# Patient Record
Sex: Female | Born: 1970 | Race: Black or African American | Hispanic: No | Marital: Single | Smoking: Current every day smoker
Health system: Southern US, Community
[De-identification: ages and names within clinical notes are randomized; demographics above are authoritative.]

## PROBLEM LIST (undated history)

## (undated) DIAGNOSIS — I429 Cardiomyopathy, unspecified: Secondary | ICD-10-CM

## (undated) DIAGNOSIS — I5022 Chronic systolic (congestive) heart failure: Secondary | ICD-10-CM

## (undated) DIAGNOSIS — F1910 Other psychoactive substance abuse, uncomplicated: Secondary | ICD-10-CM

## (undated) DIAGNOSIS — I10 Essential (primary) hypertension: Secondary | ICD-10-CM

## (undated) HISTORY — DX: Chronic systolic (congestive) heart failure: I50.22

## (undated) HISTORY — PX: LEG SURGERY: SHX1003

---

## 2009-01-11 DIAGNOSIS — I429 Cardiomyopathy, unspecified: Secondary | ICD-10-CM

## 2009-01-11 HISTORY — DX: Cardiomyopathy, unspecified: I42.9

## 2014-03-29 ENCOUNTER — Inpatient Hospital Stay (HOSPITAL_COMMUNITY)
Admission: EM | Admit: 2014-03-29 | Discharge: 2014-04-02 | DRG: 291 | Disposition: A | Discharge status: Home / Self Care | Payer: Medicare Other | Attending: Internal Medicine | Admitting: Internal Medicine

## 2014-03-29 ENCOUNTER — Encounter (HOSPITAL_COMMUNITY): Payer: Self-pay | Admitting: Emergency Medicine

## 2014-03-29 ENCOUNTER — Emergency Department (HOSPITAL_COMMUNITY): Payer: Medicare Other

## 2014-03-29 DIAGNOSIS — I5020 Unspecified systolic (congestive) heart failure: Secondary | ICD-10-CM

## 2014-03-29 DIAGNOSIS — J9621 Acute and chronic respiratory failure with hypoxia: Secondary | ICD-10-CM | POA: Diagnosis present

## 2014-03-29 DIAGNOSIS — F329 Major depressive disorder, single episode, unspecified: Secondary | ICD-10-CM | POA: Diagnosis present

## 2014-03-29 DIAGNOSIS — J209 Acute bronchitis, unspecified: Secondary | ICD-10-CM | POA: Diagnosis present

## 2014-03-29 DIAGNOSIS — J9801 Acute bronchospasm: Secondary | ICD-10-CM | POA: Diagnosis present

## 2014-03-29 DIAGNOSIS — J90 Pleural effusion, not elsewhere classified: Secondary | ICD-10-CM

## 2014-03-29 DIAGNOSIS — R0602 Shortness of breath: Secondary | ICD-10-CM | POA: Diagnosis not present

## 2014-03-29 DIAGNOSIS — I5023 Acute on chronic systolic (congestive) heart failure: Secondary | ICD-10-CM | POA: Diagnosis not present

## 2014-03-29 DIAGNOSIS — F17210 Nicotine dependence, cigarettes, uncomplicated: Secondary | ICD-10-CM | POA: Diagnosis present

## 2014-03-29 DIAGNOSIS — F1120 Opioid dependence, uncomplicated: Secondary | ICD-10-CM | POA: Diagnosis present

## 2014-03-29 DIAGNOSIS — I5021 Acute systolic (congestive) heart failure: Secondary | ICD-10-CM

## 2014-03-29 DIAGNOSIS — R911 Solitary pulmonary nodule: Secondary | ICD-10-CM

## 2014-03-29 DIAGNOSIS — J439 Emphysema, unspecified: Secondary | ICD-10-CM | POA: Diagnosis present

## 2014-03-29 DIAGNOSIS — I10 Essential (primary) hypertension: Secondary | ICD-10-CM | POA: Diagnosis present

## 2014-03-29 DIAGNOSIS — Z79899 Other long term (current) drug therapy: Secondary | ICD-10-CM

## 2014-03-29 DIAGNOSIS — R072 Precordial pain: Secondary | ICD-10-CM

## 2014-03-29 DIAGNOSIS — O903 Peripartum cardiomyopathy: Secondary | ICD-10-CM | POA: Diagnosis present

## 2014-03-29 DIAGNOSIS — I509 Heart failure, unspecified: Secondary | ICD-10-CM

## 2014-03-29 DIAGNOSIS — J9600 Acute respiratory failure, unspecified whether with hypoxia or hypercapnia: Secondary | ICD-10-CM | POA: Diagnosis present

## 2014-03-29 HISTORY — DX: Other psychoactive substance abuse, uncomplicated: F19.10

## 2014-03-29 HISTORY — DX: Essential (primary) hypertension: I10

## 2014-03-29 HISTORY — DX: Cardiomyopathy, unspecified: I42.9

## 2014-03-29 LAB — BASIC METABOLIC PANEL
ANION GAP: 9 (ref 5–15)
BUN: 8 mg/dL (ref 6–23)
CALCIUM: 8.8 mg/dL (ref 8.4–10.5)
CO2: 26 mmol/L (ref 19–32)
CREATININE: 0.6 mg/dL (ref 0.50–1.10)
Chloride: 104 mmol/L (ref 96–112)
GFR calc Af Amer: >90 mL/min (ref >90)
GFR calc non Af Amer: >90 mL/min (ref >90)
GLUCOSE: 134 mg/dL — AB (ref 70–99)
Potassium: 3.1 mmol/L — ABNORMAL LOW (ref 3.5–5.1)
Sodium: 139 mmol/L (ref 135–145)

## 2014-03-29 LAB — CBC
HEMATOCRIT: 39.2 % (ref 36.0–46.0)
Hemoglobin: 12.9 g/dL (ref 12.0–15.0)
MCH: 29.1 pg (ref 26.0–34.0)
MCHC: 32.9 g/dL (ref 30.0–36.0)
MCV: 88.3 fL (ref 78.0–100.0)
Platelets: 174 10*3/uL (ref 150–400)
RBC: 4.44 MIL/uL (ref 3.87–5.11)
RDW: 13.4 % (ref 11.5–15.5)
WBC: 10.8 10*3/uL — AB (ref 4.0–10.5)

## 2014-03-29 LAB — BRAIN NATRIURETIC PEPTIDE: B Natriuretic Peptide: 555.1 pg/mL — ABNORMAL HIGH (ref 0.0–100.0)

## 2014-03-29 MED ORDER — FUROSEMIDE 20 MG PO TABS: 20.0000 mg | ORAL_TABLET | Freq: Every day | ORAL | Status: DC

## 2014-03-29 MED ORDER — IOHEXOL 350 MG/ML SOLN
100.0000 mL | Freq: Once | INTRAVENOUS | Status: AC | PRN
  Administered 2014-03-29: 100 mL via INTRAVENOUS

## 2014-03-29 NOTE — Discharge Instructions (Signed)
Please call your doctor for a followup appointment within 24-48 hours. When you talk to your doctor please let them know that you were seen in the emergency department and have them acquire all of your records so that they can discuss the findings with you and formulate a treatment plan to fully care for your new and ongoing problems. ° °RESOURCE GUIDE ° °Chronic Pain Problems: °Contact [facility]  [phone] °Patients need to be referred by their primary care doctor. ° °Insufficient Money for Medicine: °Contact [employer]:  call "211."  ° °No Primary Care Doctor: °- Call [facility]  [phone] - can help you locate a primary care doctor that  accepts your insurance, provides certain services, etc. °- Physician Referral Service- 1-800-533-3463 ° °Agencies that provide inexpensive medical care: °- [facility]  [phone] °- [facility]  [phone] °- [facility]  [phone] °- [facility]  [phone] °- [facility]  [phone] °- [facility]  [phone] ° °Medicaid-accepting [place] Providers: °- [facility]- [address] ° [phone], Mon-Fri 9am-7pm, Sat 9am-1pm °- [facility]- [address], [address] ° [phone] °- [facility]- [address], [address] ° [phone] °- [facility]- [address] [place] [address] ° [phone] °- [facility]- [address], [phone] ° Only accepts [place] Access Medicaid patients after they have their name  applied to their card ° °Self Pay (no insurance) in [place]: °- Sickle Cell Patients: Dr Eric Dean, [facility] ° [address], [phone] °- [facility]- [address] ° [phone] °      -     [facility]- [address] °      -     [facility]- see information above (Speak to Pam H if you do not have  insurance) °      -  [facility] [place]- [address],  [phone] °      -  [facility]- [address] [place] [address], [phone] °      -  Dr Osei-Bonsu-  [address], [place], [phone] °      -  [facility] - [address], [phone] °      -  [facility]- [address] [place] [address], [phone], also [address], [phone] °      -    [facility]- [address], [phone], 1st & 3rd Saturday °       every month, 10am-1pm ° °[facility] °[address] °, [place] [address] °([phone]) [phone] ° °[facility] °[address] °[place], [place] [address] °([phone]) [phone] ° °1) Find a Doctor and Pay Out of Pocket °Although you won't have to find out who is covered by your insurance plan, it is a good idea to ask around and get recommendations. You will then need to call the office and see if the doctor you have chosen will accept you as a new patient and what types of options they offer for patients who are self-pay. Some doctors offer discounts or will set up payment plans for their patients who do not have insurance, but you will need to ask so you aren't surprised when you   get to your appointment. ° °2) Contact Your Local Health Department °Not all health departments have doctors that can see patients for sick visits, but many do, so it is worth a call to see if yours does. If you don't know where your local health department is, you can check in your phone book. The CDC also has a tool to help you locate your state's health department, and many state websites also have listings of all of their local health departments. ° °3) Find a Walk-in Clinic °If your illness is not likely to be very severe or complicated, you may want to try a walk in clinic. These are popping up all over the country in pharmacies, drugstores, and shopping centers. They're usually staffed by nurse practitioners or physician  assistants that have been trained to treat common illnesses and complaints. They're usually fairly quick and inexpensive. However, if you have serious medical issues or chronic medical problems, these are probably not your best option ° °STD Testing °- [place] [facility] [place], [facility], [address], [place], phone [phone] or [phone].  Monday - Friday, call for an appointment. °- [place] [facility] [place], [facility], [address], [place], phone [phone] or [phone].  Monday - Friday, call for an appointment. ° °Abuse/Neglect: °- [place] Child Abuse Hotline (336) 641-3795 °- [place] Child Abuse Hotline 800-378-5315 (After Hours) ° °Emergency Shelter:  [facility] (336) 271-5985 ° °Maternity Homes: °- [facility] (336) 275-9566 °- [facility] (704) 372-4663 ° °MRSA Hotline #:   832-7006 ° °Dental Assistance °If unable to pay or uninsured, contact:  [place] [facility] to become qualified for the adult dental clinic. ° °Patients with Medicaid: [facility] °[address], [phone] °[address], [phone] ° °If unable to pay, or uninsured, contact [place] [facility] ([phone] in [place], [phone] in [place]) to become qualified for the adult dental clinic ° °[facility] °[address] °[place], [place] [address] °(336) 272-4177 °[email or web address] ° °Other Low-Cost Community Dental Services: °- [facility]- [address], [place], [place], [address], [phone], Ext. [address], 2nd and 4th Thursday of the month at 6:30am.  10 clients each day by appointment, can sometimes see walk-in patients if someone does not show for an appointment. °- [facility]- [address], [place], [place], [address], [phone] °- [facility]- [address], [place], [place],  [address], [phone] °- [facility]- [phone] °- [facility]- [phone] °- [facility]- [phone] °-  °

## 2014-03-29 NOTE — ED Notes (Signed)
Patient transported to CT 

## 2014-03-29 NOTE — ED Notes (Signed)
Pt called out requesting for something to eat or drink.  Pt made aware that her CT needs to be resulted before she can have anything.  She started to argue with this nurse and states that that's not what she was informed.  Then she states "oh well" and started to drink from the cup that she has in her purse.

## 2014-03-29 NOTE — ED Provider Notes (Signed)
CSN: [id number]     Arrival date & time 03/29/14  1707 History   First MD Initiated Contact with Patient 03/29/14 1744     Chief Complaint  Patient presents with  . Shortness of Breath     (Consider location/radiation/quality/duration/timing/severity/associated sxs/prior Treatment) HPI Comments: The patient is a 44 year old female, she reports a history of a cardiomyopathy which was postpartum when her child was born 11 years ago. 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 Several days ago the patient reports having increasing shortness of breath, some exertional dyspnea, some wheezing occasionally and some tightness in her chest. At this time the patient does not have any symptoms. She states that she aborted a pregnancy one month ago because she did not want to go back into pulmonary edema, she was having some shortness of breath at that time. She denies fevers chills nausea vomiting diarrhea or swelling of the lower extremities, she states that she has a minimal cough. She was found to be hypoxic in triage, she was placed on supplemental oxygen at 2 L with a resulting oxygen of 95%.  Patient is a 44 y.o. female presenting with shortness of breath. The history is provided by the patient.  Shortness of Breath   Past Medical History  Diagnosis Date  . Cardiomyopathy 2011   History reviewed. No pertinent past surgical history. No family history on file. History  Substance Use Topics  . Smoking status: Current Every Day Smoker -- 0.50 packs/day  . Smokeless tobacco: Not on file  . Alcohol Use: No   OB History    No data available     Review of Systems  Respiratory: Positive for shortness of breath.   All other systems reviewed and are negative.     Allergies  Review of patient's allergies indicates no known allergies.  Home Medications    Prior to Admission medications   Medication Sig Start Date End Date Taking? Authorizing Provider  carvedilol (COREG) 12.5 MG tablet Take 12.5 mg by mouth 2 (two) times daily with a meal.   Yes Historical Provider, MD  diphenhydrAMINE (BENADRYL) 25 mg capsule Take 25 mg by mouth every 6 (six) hours as needed for allergies or sleep.   Yes Historical Provider, MD  DULoxetine (CYMBALTA) 60 MG capsule Take 60 mg by mouth daily.   Yes Historical Provider, MD  furosemide (LASIX) 20 MG tablet Take 1 tablet (20 mg total) by mouth daily. 03/29/14   Eber Hong, MD  methadone (DOLOPHINE) 10 MG/ML solution Take 70 mg by mouth daily.   Yes Historical Provider, MD  valsartan (DIOVAN) 160 MG tablet Take 160 mg by mouth daily.   Yes Historical Provider, MD   BP 149/92 mmHg  Pulse 79  Temp(Src) 97.7 F (36.5 C) (Oral)  Resp 27  SpO2 94%  LMP 10/11/2013 (Approximate) Physical Exam  Constitutional: She appears well-developed and well-nourished. No distress.  HENT:  Head: Normocephalic and atraumatic.  Mouth/Throat: Oropharynx is clear and moist. No oropharyngeal exudate.  Eyes: Conjunctivae and EOM are normal. Pupils are equal, round, and reactive to light. Right eye exhibits no discharge. Left eye exhibits no discharge. No scleral icterus.  Neck: Normal range of motion. Neck supple. No JVD present. No thyromegaly present.  Cardiovascular: Normal rate, regular rhythm, normal heart sounds and intact distal pulses.  Exam reveals no gallop  and no friction rub.   No murmur heard. Pulmonary/Chest: Effort normal and breath sounds normal. No respiratory distress. She has no wheezes. She has no rales.  Abdominal: Soft. Bowel sounds are normal. She exhibits no distension and no mass. There is no tenderness.  Musculoskeletal: Normal range of motion. She exhibits no edema or tenderness.  Lymphadenopathy:    She has no cervical adenopathy.  Neurological: She is alert. Coordination normal.  Skin: Skin is warm and  dry. No rash noted. No erythema.  Psychiatric: She has a normal mood and affect. Her behavior is normal.  Nursing note and vitals reviewed.   ED Course  Procedures (including critical care time) Labs Review Labs Reviewed  CBC - Abnormal; Notable for the following:    WBC 10.8 (*)    All other components within normal limits  BASIC METABOLIC PANEL - Abnormal; Notable for the following:    Potassium 3.1 (*)    Glucose, Bld 134 (*)    All other components within normal limits  BRAIN NATRIURETIC PEPTIDE - Abnormal; Notable for the following:    B Natriuretic Peptide 555.1 (*)    All other components within normal limits  I-STAT TROPOININ, ED  POC URINE PREG, ED    Imaging Review Dg Chest 2 View  03/29/2014   CLINICAL DATA:  Shortness of breath, dry cough.  Smoker.  EXAM: CHEST  2 VIEW  COMPARISON:  None.  FINDINGS: Heart size enlarged. Aortic arch with mild atherosclerosis. Bihilar fullness. Right greater than left lung base opacities. No pleural effusion or pneumothorax. No acute osseous finding.  IMPRESSION: Heart size enlarged.  Bihilar fullness may reflect vascular congestion. Is there outside prior imaging to document stability? If not, consider chest CT to exclude adenopathy.  Lung base opacities may reflect atelectasis, scarring, and/or pneumonia.   Electronically Signed   By: Jearld Lesch M.D.   On: 03/29/2014 19:07   Ct Angio Chest Pe W/cm &/or Wo Cm  03/29/2014   CLINICAL DATA:  Shortness of breath for 3 days wheezing for 2 days with chest tightness. History of cardiomyopathy.  EXAM: CT ANGIOGRAPHY CHEST WITH CONTRAST  TECHNIQUE: Multidetector CT imaging of the chest was performed using the standard protocol during bolus administration of intravenous contrast. Multiplanar CT image reconstructions and MIPs were obtained to evaluate the vascular anatomy.  CONTRAST:  OMNIPAQUE IOHEXOL 350 MG/ML SOLN  COMPARISON:  Chest radiograph March 29, 2014 at 1849 hours  FINDINGS:  PULMONARY ARTERY: Adequate contrast opacification of the pulmonary artery's. Main pulmonary artery is not enlarged. No pulmonary arterial filling defects to the level of the subsegmental branches.  MEDIASTINUM: Heart is moderately enlarged. No RIGHT heart strain. No pericardial fluid collections contrast refluxing into the inferior vena cava. Thoracic aorta is normal course and caliber, unremarkable. Hilar lymphadenopathy bilaterally, borderline subcarinal lymphadenopathy.  LUNGS: Tracheobronchial tree is patent, no pneumothorax. Peribronchial wall thickening. Moderate apparent centrilobular emphysema. Patchy consolidation in the RIGHT greater than LEFT lung bases with small RIGHT pleural effusion. 5 mm sub solid pulmonary nodule LEFT lung apex. Mild paraseptal emphysema.  SOFT TISSUES AND OSSEOUS STRUCTURES: Included view of the abdomen is unremarkable. Visualized soft tissues and included osseous structures appear normal.  Review of the MIP images confirms the above findings.  IMPRESSION: No acute pulmonary embolism.  Moderate cardiomegaly. Bronchial wall thickening may refer reflect bronchitis or pulmonary edema with small RIGHT pleural effusion. Patchy presumed atelectasis in the lung bases.  Symmetric hilar lymphadenopathy, which could be infectious or inflammatory, recommend  follow-up.  Moderate centrilobular and to lesser extent paraseptal emphysema.  5 mm LEFT apical sub solid pulmonary nodule. Initial follow-up by chest CT without contrast is recommended in 3 months to confirm persistence. This recommendation follows the consensus statement: Recommendations for the Management of Subsolid Pulmonary Nodules Detected at CT: A Statement from the Fleischner Society as published in Radiology 2013; 266:304-317.   Electronically Signed   By: Awilda Metroourtnay  Bloomer   On: 03/29/2014 22:27     EKG Interpretation   Date/Time:  Friday March 29 2014 17:39:17 EDT Ventricular Rate:  83 PR Interval:  134 QRS Duration:  94 QT Interval:  437 QTC Calculation: 513 R Axis:   2 Text Interpretation:  Sinus rhythm LVH with secondary repolarization  abnormality Prolonged QT interval Baseline wander in lead(s) V5 V6  Abnormal ekg No old tracing to compare Confirmed by Marge Vandermeulen  MD, Calvina Liptak  [id number]) on 03/29/2014 5:44:52 PM      MDM   Final diagnoses:  SOB (shortness of breath)  Pleural effusion  Systolic congestive heart failure, unspecified congestive heart failure chronicity  Lung nodule    The patient has no JVD, no wheezing, oxygen of 95% on room air on my exam, there is no peripheral edema, no rales, check a chest x-ray and labs, EKG is abnormal with T wave inversions, there is no old tracing as the patient has just recently moved here. I suspect that her abnormal findings are related to left ventricular hypertrophy.  Pt has had ongoing dyspnea, she has hypoxia with ambulation - CT shows no obvious source, BNP > 400, will need admission.    Eber HongBrian Pearlean Sabina, MD 03/30/14 [phone]

## 2014-03-29 NOTE — ED Notes (Signed)
Patient transported to MRI 

## 2014-03-29 NOTE — ED Notes (Signed)
Pt is with CT 

## 2014-03-29 NOTE — ED Notes (Signed)
Went on to speak to pt, pt was asking for something to drink.  Explained to pt the reason that she cannot have anything to eat or drink at this time.  Pt then reports to this nurse that she is upset towards her daughter.  She states that she had asked her not to go to sleep and to come back to the ED to stay with her, but is now not picking up her phone.  She states that she shouldve stayed in [place] instead of moving here with her daughter.  Pt reports that she has been hospitalized in the past for 10 days for her cardiac problem and fluids in her lungs and her heart.

## 2014-03-29 NOTE — ED Notes (Addendum)
Patient came in due to shortness of breath.  Patient denies chest pain, is not drooling, and chest is assessed to be symmetrical upon breathing. Patient vocalized that she feels as though she has been wheezing since yesterday.  Patient feels as though her chest is "tight and closing in on me". Patient states she has a hx of cardiomyopathy dx in 2004.  Explained they found "fluid around my heart and lungs".  Patient was placed on 2L via nasal cannula and SpO2 was increased to 93%.

## 2014-03-29 NOTE — ED Notes (Signed)
Patient transported to X-ray 

## 2014-03-29 NOTE — ED Notes (Signed)
Pt reports SOB, hx of cardiomyopathy.  Denies cp at this time.  Pt was hypoxic on RA.  Placed on O2.  Pt reports she does not have home O2.

## 2014-03-29 NOTE — ED Notes (Signed)
Walked the pt around the department. Upon arriving back into her room her O2 stats were 85

## 2014-03-30 ENCOUNTER — Inpatient Hospital Stay (HOSPITAL_COMMUNITY): Payer: Medicare Other

## 2014-03-30 ENCOUNTER — Encounter (HOSPITAL_COMMUNITY): Payer: Self-pay | Admitting: Emergency Medicine

## 2014-03-30 DIAGNOSIS — I509 Heart failure, unspecified: Secondary | ICD-10-CM | POA: Diagnosis not present

## 2014-03-30 DIAGNOSIS — I5021 Acute systolic (congestive) heart failure: Secondary | ICD-10-CM | POA: Diagnosis not present

## 2014-03-30 DIAGNOSIS — R911 Solitary pulmonary nodule: Secondary | ICD-10-CM | POA: Diagnosis present

## 2014-03-30 DIAGNOSIS — F17210 Nicotine dependence, cigarettes, uncomplicated: Secondary | ICD-10-CM | POA: Diagnosis present

## 2014-03-30 DIAGNOSIS — I5023 Acute on chronic systolic (congestive) heart failure: Secondary | ICD-10-CM | POA: Diagnosis present

## 2014-03-30 DIAGNOSIS — J9801 Acute bronchospasm: Secondary | ICD-10-CM | POA: Diagnosis present

## 2014-03-30 DIAGNOSIS — R0602 Shortness of breath: Secondary | ICD-10-CM | POA: Insufficient documentation

## 2014-03-30 DIAGNOSIS — J9600 Acute respiratory failure, unspecified whether with hypoxia or hypercapnia: Secondary | ICD-10-CM | POA: Diagnosis present

## 2014-03-30 DIAGNOSIS — Z79899 Other long term (current) drug therapy: Secondary | ICD-10-CM | POA: Diagnosis not present

## 2014-03-30 DIAGNOSIS — F1120 Opioid dependence, uncomplicated: Secondary | ICD-10-CM | POA: Diagnosis present

## 2014-03-30 DIAGNOSIS — J209 Acute bronchitis, unspecified: Secondary | ICD-10-CM | POA: Diagnosis present

## 2014-03-30 DIAGNOSIS — J9621 Acute and chronic respiratory failure with hypoxia: Secondary | ICD-10-CM | POA: Diagnosis present

## 2014-03-30 DIAGNOSIS — O903 Peripartum cardiomyopathy: Secondary | ICD-10-CM | POA: Diagnosis present

## 2014-03-30 DIAGNOSIS — I10 Essential (primary) hypertension: Secondary | ICD-10-CM | POA: Diagnosis present

## 2014-03-30 DIAGNOSIS — I517 Cardiomegaly: Secondary | ICD-10-CM | POA: Diagnosis not present

## 2014-03-30 DIAGNOSIS — J439 Emphysema, unspecified: Secondary | ICD-10-CM | POA: Diagnosis present

## 2014-03-30 DIAGNOSIS — F329 Major depressive disorder, single episode, unspecified: Secondary | ICD-10-CM | POA: Diagnosis present

## 2014-03-30 LAB — COMPREHENSIVE METABOLIC PANEL
ALBUMIN: 3.4 g/dL — AB (ref 3.5–5.2)
ALT: 14 U/L (ref 0–35)
AST: 17 U/L (ref 0–37)
Alkaline Phosphatase: 66 U/L (ref 39–117)
Anion gap: 10 (ref 5–15)
BILIRUBIN TOTAL: 0.5 mg/dL (ref 0.3–1.2)
BUN: 6 mg/dL (ref 6–23)
CHLORIDE: 105 mmol/L (ref 96–112)
CO2: 25 mmol/L (ref 19–32)
Calcium: 8.8 mg/dL (ref 8.4–10.5)
Creatinine, Ser: 0.46 mg/dL — ABNORMAL LOW (ref 0.50–1.10)
GFR calc Af Amer: >90 mL/min (ref >90)
GFR calc non Af Amer: >90 mL/min (ref >90)
GLUCOSE: 103 mg/dL — AB (ref 70–99)
Potassium: 3.6 mmol/L (ref 3.5–5.1)
SODIUM: 140 mmol/L (ref 135–145)
TOTAL PROTEIN: 6.8 g/dL (ref 6.0–8.3)

## 2014-03-30 LAB — BLOOD GAS, ARTERIAL
Acid-Base Excess: 0.5 mmol/L (ref 0.0–2.0)
Bicarbonate: 24.4 mEq/L — ABNORMAL HIGH (ref 20.0–24.0)
Drawn by: 331471
FIO2: 0.55 %
O2 Saturation: 92 %
PCO2 ART: 38.7 mmHg (ref 35.0–45.0)
PH ART: 7.415 (ref 7.350–7.450)
Patient temperature: 98.6
TCO2: 21.3 mmol/L (ref 0–100)
pO2, Arterial: 66.8 mmHg — ABNORMAL LOW (ref 80.0–100.0)

## 2014-03-30 LAB — CBC WITH DIFFERENTIAL/PLATELET
BASOS ABS: 0 10*3/uL (ref 0.0–0.1)
Basophils Relative: 0 % (ref 0–1)
EOS ABS: 0.2 10*3/uL (ref 0.0–0.7)
Eosinophils Relative: 2 % (ref 0–5)
HCT: 37.4 % (ref 36.0–46.0)
Hemoglobin: 12.4 g/dL (ref 12.0–15.0)
Lymphocytes Relative: 44 % (ref 12–46)
Lymphs Abs: 4.4 10*3/uL — ABNORMAL HIGH (ref 0.7–4.0)
MCH: 29.2 pg (ref 26.0–34.0)
MCHC: 33.2 g/dL (ref 30.0–36.0)
MCV: 88 fL (ref 78.0–100.0)
MONOS PCT: 10 % (ref 3–12)
Monocytes Absolute: 1 10*3/uL (ref 0.1–1.0)
NEUTROS ABS: 4.3 10*3/uL (ref 1.7–7.7)
Neutrophils Relative %: 44 % (ref 43–77)
Platelets: 152 10*3/uL (ref 150–400)
RBC: 4.25 MIL/uL (ref 3.87–5.11)
RDW: 13.6 % (ref 11.5–15.5)
WBC: 9.9 10*3/uL (ref 4.0–10.5)

## 2014-03-30 LAB — RAPID URINE DRUG SCREEN, HOSP PERFORMED
Amphetamines: NOT DETECTED
BARBITURATES: NOT DETECTED
Benzodiazepines: NOT DETECTED
Cocaine: NOT DETECTED
Opiates: NOT DETECTED
TETRAHYDROCANNABINOL: NOT DETECTED

## 2014-03-30 LAB — CBC
HCT: 37.7 % (ref 36.0–46.0)
Hemoglobin: 12.4 g/dL (ref 12.0–15.0)
MCH: 28.9 pg (ref 26.0–34.0)
MCHC: 32.9 g/dL (ref 30.0–36.0)
MCV: 87.9 fL (ref 78.0–100.0)
PLATELETS: 172 10*3/uL (ref 150–400)
RBC: 4.29 MIL/uL (ref 3.87–5.11)
RDW: 13.7 % (ref 11.5–15.5)
WBC: 10 10*3/uL (ref 4.0–10.5)

## 2014-03-30 LAB — TROPONIN I: Troponin I: <0.03 ng/mL (ref <0.031)

## 2014-03-30 LAB — CREATININE, SERUM
Creatinine, Ser: 0.53 mg/dL (ref 0.50–1.10)
GFR calc Af Amer: >90 mL/min (ref >90)
GFR calc non Af Amer: >90 mL/min (ref >90)

## 2014-03-30 LAB — C-REACTIVE PROTEIN: CRP: 1.2 mg/dL — AB (ref <0.60)

## 2014-03-30 LAB — SEDIMENTATION RATE: Sed Rate: 30 mm/hr — ABNORMAL HIGH (ref 0–22)

## 2014-03-30 LAB — MAGNESIUM: Magnesium: 1.9 mg/dL (ref 1.5–2.5)

## 2014-03-30 LAB — MRSA PCR SCREENING: MRSA by PCR: NEGATIVE

## 2014-03-30 MED ORDER — ZOLPIDEM TARTRATE 5 MG PO TABS
5.0000 mg | ORAL_TABLET | Freq: Once | ORAL | Status: AC
  Administered 2014-03-30: 5 mg via ORAL
  Filled 2014-03-30: qty 1

## 2014-03-30 MED ORDER — IPRATROPIUM-ALBUTEROL 0.5-2.5 (3) MG/3ML IN SOLN: 3.0000 mL | RESPIRATORY_TRACT | Status: DC | PRN

## 2014-03-30 MED ORDER — DOXYCYCLINE HYCLATE 100 MG IV SOLR
100.0000 mg | Freq: Two times a day (BID) | INTRAVENOUS | Status: DC
  Administered 2014-03-30: 100 mg via INTRAVENOUS
  Filled 2014-03-30 (×2): qty 100

## 2014-03-30 MED ORDER — PIPERACILLIN-TAZOBACTAM 3.375 G IVPB
3.3750 g | Freq: Four times a day (QID) | INTRAVENOUS | Status: DC
  Administered 2014-03-30: 3.375 g via INTRAVENOUS
  Filled 2014-03-30: qty 50

## 2014-03-30 MED ORDER — METHADONE HCL 10 MG/ML PO CONC
70.0000 mg | Freq: Every day | ORAL | Status: DC
  Administered 2014-03-30 – 2014-04-02 (×4): 70 mg via ORAL
  Filled 2014-03-30 (×4): qty 7

## 2014-03-30 MED ORDER — VANCOMYCIN HCL IN DEXTROSE 1-5 GM/200ML-% IV SOLN
1000.0000 mg | Freq: Two times a day (BID) | INTRAVENOUS | Status: DC
  Administered 2014-03-30 – 2014-03-31 (×3): 1000 mg via INTRAVENOUS
  Filled 2014-03-30 (×4): qty 200

## 2014-03-30 MED ORDER — HEPARIN SODIUM (PORCINE) 5000 UNIT/ML IJ SOLN
5000.0000 [IU] | Freq: Three times a day (TID) | INTRAMUSCULAR | Status: DC
  Administered 2014-03-30 – 2014-04-02 (×12): 5000 [IU] via SUBCUTANEOUS
  Filled 2014-03-30 (×15): qty 1

## 2014-03-30 MED ORDER — IRBESARTAN 150 MG PO TABS
150.0000 mg | ORAL_TABLET | Freq: Every day | ORAL | Status: DC
  Administered 2014-03-31: 150 mg via ORAL
  Filled 2014-03-30 (×3): qty 1

## 2014-03-30 MED ORDER — IPRATROPIUM-ALBUTEROL 0.5-2.5 (3) MG/3ML IN SOLN
3.0000 mL | Freq: Three times a day (TID) | RESPIRATORY_TRACT | Status: DC
  Administered 2014-03-31 – 2014-04-02 (×7): 3 mL via RESPIRATORY_TRACT
  Filled 2014-03-30 (×8): qty 3

## 2014-03-30 MED ORDER — LORAZEPAM 2 MG/ML IJ SOLN
1.0000 mg | Freq: Once | INTRAMUSCULAR | Status: AC
  Administered 2014-03-30: 1 mg via INTRAVENOUS
  Filled 2014-03-30: qty 1

## 2014-03-30 MED ORDER — IPRATROPIUM-ALBUTEROL 0.5-2.5 (3) MG/3ML IN SOLN
3.0000 mL | RESPIRATORY_TRACT | Status: DC
  Administered 2014-03-30 (×5): 3 mL via RESPIRATORY_TRACT
  Filled 2014-03-30 (×5): qty 3

## 2014-03-30 MED ORDER — ONDANSETRON HCL 4 MG/2ML IJ SOLN: 4.0000 mg | Freq: Three times a day (TID) | INTRAMUSCULAR | Status: AC | PRN

## 2014-03-30 MED ORDER — FUROSEMIDE 10 MG/ML IJ SOLN
40.0000 mg | Freq: Two times a day (BID) | INTRAMUSCULAR | Status: DC
  Administered 2014-03-30 – 2014-04-01 (×6): 40 mg via INTRAVENOUS
  Filled 2014-03-30 (×6): qty 4

## 2014-03-30 MED ORDER — METHYLPREDNISOLONE SODIUM SUCC 125 MG IJ SOLR
60.0000 mg | Freq: Four times a day (QID) | INTRAMUSCULAR | Status: DC
  Administered 2014-03-30 – 2014-03-31 (×4): 60 mg via INTRAVENOUS
  Filled 2014-03-30 (×4): qty 2

## 2014-03-30 MED ORDER — LORAZEPAM 2 MG/ML IJ SOLN
1.0000 mg | Freq: Three times a day (TID) | INTRAMUSCULAR | Status: DC | PRN
  Administered 2014-03-31: 1 mg via INTRAVENOUS
  Filled 2014-03-30: qty 1

## 2014-03-30 MED ORDER — DULOXETINE HCL 60 MG PO CPEP
60.0000 mg | ORAL_CAPSULE | Freq: Every day | ORAL | Status: DC
  Administered 2014-03-31 – 2014-04-02 (×3): 60 mg via ORAL
  Filled 2014-03-30 (×3): qty 1
  Filled 2014-03-30: qty 2
  Filled 2014-03-30: qty 1

## 2014-03-30 MED ORDER — METHYLPREDNISOLONE SODIUM SUCC 125 MG IJ SOLR
125.0000 mg | Freq: Two times a day (BID) | INTRAMUSCULAR | Status: DC
  Administered 2014-03-30: 125 mg via INTRAVENOUS
  Filled 2014-03-30 (×2): qty 2

## 2014-03-30 MED ORDER — NICOTINE 14 MG/24HR TD PT24
14.0000 mg | MEDICATED_PATCH | Freq: Every day | TRANSDERMAL | Status: DC
  Administered 2014-03-30 – 2014-04-02 (×4): 14 mg via TRANSDERMAL
  Filled 2014-03-30 (×4): qty 1

## 2014-03-30 MED ORDER — POTASSIUM CHLORIDE IN NACL 20-0.9 MEQ/L-% IV SOLN
INTRAVENOUS | Status: DC
  Administered 2014-03-30: 04:00:00 via INTRAVENOUS
  Filled 2014-03-30 (×2): qty 1000

## 2014-03-30 MED ORDER — PIPERACILLIN-TAZOBACTAM 3.375 G IVPB
3.3750 g | Freq: Three times a day (TID) | INTRAVENOUS | Status: DC
  Administered 2014-03-30 – 2014-04-02 (×9): 3.375 g via INTRAVENOUS
  Filled 2014-03-30 (×9): qty 50

## 2014-03-30 MED ORDER — SODIUM CHLORIDE 0.9 % IJ SOLN
3.0000 mL | Freq: Two times a day (BID) | INTRAMUSCULAR | Status: DC
  Administered 2014-03-30 – 2014-04-02 (×6): 3 mL via INTRAVENOUS

## 2014-03-30 MED ORDER — CARVEDILOL 12.5 MG PO TABS
12.5000 mg | ORAL_TABLET | Freq: Two times a day (BID) | ORAL | Status: DC
  Administered 2014-03-30 – 2014-04-02 (×7): 12.5 mg via ORAL
  Filled 2014-03-30 (×12): qty 1

## 2014-03-30 NOTE — ED Notes (Signed)
Called to give report. Receiving RN participating in a code blue at the time.

## 2014-03-30 NOTE — ED Notes (Signed)
Secretary paged MD without call back, will page again.

## 2014-03-30 NOTE — ED Notes (Signed)
Bed: [id number] Expected date:  Expected time:  Means of arrival:  Comments: Hold for room 19

## 2014-03-30 NOTE — H&P (Signed)
Hospitalist Admission History and Physical  Patient name: Julia Payne Medical record number: [id number] Date of birth: Mar 12, 1970 Age: 44 y.o. Gender: female  Primary Care Provider: No primary care provider on file.  Chief Complaint: acute resp failure w/ hypoxia  History of Present Illness:This is a 44 y.o. year old female with significant past medical history of postpartum cardiomyopathy, tobacco abuse, IV drug use presenting with acute respiratory failure with hypoxia. Patient reports approximate one month of progressive shortness of breath as well as subjective fevers and chills at home. Is originally from [place]. Relocated to the area recently. 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 Also reports a long-standing history of tobacco abuse. History of IV heroin use. Patient states she quit using approximately 1 month ago. Denies any other illicit drug use. Positive wheezing. Positive cough. Denies any lower extremity swelling. Progressively worsening DOE. Presented to the ER temperature 97.7, heart rate in the 60s to 80s, respirations and intense 20s, blood pressure in the 130s to 140s. Satting 92% on 2 L. However, desats into the mid 80s with ambulation. White blood cell count 10.8, hemoglobin 12.9, creatinine 0.6, potassium 3.1. CT NGO negative for PE. However does show moderate cardiomegaly bronchial wall thickening which may reflect bronchitis or edema. Positive symmetric hilar lymphadenopathy which could be infectious or inflammatory. Moderates emphysema. 5 mm left apical sub-solid pulmonary nodule. BNP 555. EKG sinus rhythm with LVH.    Assessment and Plan: Julia Salesynisha Pfiffner is a 44 y.o. year old female presenting with acute resp failure w/ hypoxia    Active Problems:   Dyspnea   Acute respiratory failure   1- Acute resp failure w/ hypoxia -Likely multifactorial in the setting of subclinical/undiagnosed obstructive lung disease,?  Endocarditis -Continue supplemental oxygen. -IV Solu-Medrol -DuoNeb's -IV doxycycline  2-? Endocarditis -High concern for endocardial disease given overall presentation and IV drug use. -Noted hilar adenopathy on CT imaging as well as subtle leukocytosis -No Roth spots or petechiae on exam, however patient does have painted fingernails -Blood culture set 2 drawn at separate times -TEE -approximately 2 minor modified [facility] criteria at present -Case discussed with on-call cardiology -Formal consultation in the morning  3-HTN -BP stable -Continue home regimen  4-long term narcotic use -Continue methadone  FEN/GI: Heart healthy diet Prophylaxis: Subcutaneous heparin Disposition: Pending further evaluation Code Status: Full code   Patient Active Problem List   Diagnosis Date Noted  . Dyspnea 03/30/2014  . Acute respiratory failure 03/30/2014   Past Medical History: Past Medical History  Diagnosis Date  . Cardiomyopathy 2011    Past Surgical History: History reviewed. No pertinent past surgical history.  Social History: History   Social History  . Marital Status: Single    Spouse Name: N/A  . Number of Children: N/A  . Years of Education: N/A   Social History Main Topics  . Smoking status: Current Every Day Smoker -- 0.50 packs/day  . Smokeless tobacco: Not on file  . Alcohol Use: No  . Drug Use: No  . Sexual Activity: Not on file   Other Topics Concern  . None   Social History Narrative  . None    Family History: No family history on file.  Allergies: No Known Allergies  Current Facility-Administered Medications  Medication Dose Route Frequency Provider Last Rate Last Dose  . 0.9 % NaCl with KCl 20 mEq/ L  infusion   Intravenous Continuous Floydene FlockSteven J Mirabelle Cyphers, MD      . carvedilol (COREG) tablet 12.5  mg  12.5 mg Oral BID WC Floydene Flock, MD      . DULoxetine (CYMBALTA) DR capsule 60 mg  60 mg Oral Daily Floydene Flock, MD      . heparin  injection 5,000 Units  5,000 Units Subcutaneous 3 times per day Floydene Flock, MD      . irbesartan (AVAPRO) tablet 150 mg  150 mg Oral Daily Floydene Flock, MD      . methadone (DOLOPHINE) 10 MG/ML solution 70 mg  70 mg Oral Daily Floydene Flock, MD      . sodium chloride 0.9 % injection 3 mL  3 mL Intravenous Q12H Floydene Flock, MD       Current Outpatient Prescriptions  Medication Sig Dispense Refill  . carvedilol (COREG) 12.5 MG tablet Take 12.5 mg by mouth 2 (two) times daily with a meal.    . diphenhydrAMINE (BENADRYL) 25 mg capsule Take 25 mg by mouth every 6 (six) hours as needed for allergies or sleep.    . DULoxetine (CYMBALTA) 60 MG capsule Take 60 mg by mouth daily.    . furosemide (LASIX) 20 MG tablet Take 1 tablet (20 mg total) by mouth daily. 10 tablet 0  . methadone (DOLOPHINE) 10 MG/ML solution Take 70 mg by mouth daily.    . valsartan (DIOVAN) 160 MG tablet Take 160 mg by mouth daily.     Review Of Systems: 12 point ROS negative except as noted above in HPI.  Physical Exam: Filed Vitals:   03/30/14 0030  BP: 144/87  Pulse: 71  Temp:   Resp: 20    General: cooperative and Mildly disheveled HEENT: PERRLA and extra ocular movement intact Heart: S1, S2 normal, no murmur, rub or gallop, regular rate and rhythm Lungs: clear to auscultation, no wheezes or rales and unlabored breathing Abdomen: abdomen is soft without significant tenderness, masses, organomegaly or guarding Extremities: extremities normal, atraumatic, no cyanosis or edema Skin:no rashes, no ecchymoses Neurology: normal without focal findings  Labs and Imaging: Lab Results  Component Value Date/Time   NA 139 03/29/2014 06:12 PM   K 3.1* 03/29/2014 06:12 PM   CL 104 03/29/2014 06:12 PM   CO2 26 03/29/2014 06:12 PM   BUN 8 03/29/2014 06:12 PM   CREATININE 0.60 03/29/2014 06:12 PM   GLUCOSE 134* 03/29/2014 06:12 PM   Lab Results  Component Value Date   WBC 10.8* 03/29/2014   HGB 12.9  03/29/2014   HCT 39.2 03/29/2014   MCV 88.3 03/29/2014   PLT 174 03/29/2014    Dg Chest 2 View  03/29/2014   CLINICAL DATA:  Shortness of breath, dry cough.  Smoker.  EXAM: CHEST  2 VIEW  COMPARISON:  None.  FINDINGS: Heart size enlarged. Aortic arch with mild atherosclerosis. Bihilar fullness. Right greater than left lung base opacities. No pleural effusion or pneumothorax. No acute osseous finding.  IMPRESSION: Heart size enlarged.  Bihilar fullness may reflect vascular congestion. Is there outside prior imaging to document stability? If not, consider chest CT to exclude adenopathy.  Lung base opacities may reflect atelectasis, scarring, and/or pneumonia.   Electronically Signed   By: Jearld Lesch M.D.   On: 03/29/2014 19:07   Ct Angio Chest Pe W/cm &/or Wo Cm  03/29/2014   CLINICAL DATA:  Shortness of breath for 3 days wheezing for 2 days with chest tightness. History of cardiomyopathy.  EXAM: CT ANGIOGRAPHY CHEST WITH CONTRAST  TECHNIQUE: Multidetector CT imaging of the chest was  performed using the standard protocol during bolus administration of intravenous contrast. Multiplanar CT image reconstructions and MIPs were obtained to evaluate the vascular anatomy.  CONTRAST:  OMNIPAQUE IOHEXOL 350 MG/ML SOLN  COMPARISON:  Chest radiograph March 29, 2014 at 1849 hours  FINDINGS: PULMONARY ARTERY: Adequate contrast opacification of the pulmonary artery's. Main pulmonary artery is not enlarged. No pulmonary arterial filling defects to the level of the subsegmental branches.  MEDIASTINUM: Heart is moderately enlarged. No RIGHT heart strain. No pericardial fluid collections contrast refluxing into the inferior vena cava. Thoracic aorta is normal course and caliber, unremarkable. Hilar lymphadenopathy bilaterally, borderline subcarinal lymphadenopathy.  LUNGS: Tracheobronchial tree is patent, no pneumothorax. Peribronchial wall thickening. Moderate apparent centrilobular emphysema. Patchy  consolidation in the RIGHT greater than LEFT lung bases with small RIGHT pleural effusion. 5 mm sub solid pulmonary nodule LEFT lung apex. Mild paraseptal emphysema.  SOFT TISSUES AND OSSEOUS STRUCTURES: Included view of the abdomen is unremarkable. Visualized soft tissues and included osseous structures appear normal.  Review of the MIP images confirms the above findings.  IMPRESSION: No acute pulmonary embolism.  Moderate cardiomegaly. Bronchial wall thickening may refer reflect bronchitis or pulmonary edema with small RIGHT pleural effusion. Patchy presumed atelectasis in the lung bases.  Symmetric hilar lymphadenopathy, which could be infectious or inflammatory, recommend follow-up.  Moderate centrilobular and to lesser extent paraseptal emphysema.  5 mm LEFT apical sub solid pulmonary nodule. Initial follow-up by chest CT without contrast is recommended in 3 months to confirm persistence. This recommendation follows the consensus statement: Recommendations for the Management of Subsolid Pulmonary Nodules Detected at CT: A Statement from the Fleischner Society as published in Radiology 2013; 266:304-317.   Electronically Signed   By: Awilda Metro   On: 03/29/2014 22:27           Doree Albee MD  Pager: [phone]

## 2014-03-30 NOTE — Consult Note (Signed)
Admit date: 03/29/2014 Referring Physician  Dr. Heron NayVasireddy Primary Physician  None Primary Cardiologist  None Reason for Consultation  SOB with hypoxia  HPI: This is a 44 y.o. year old female with significant past medical history of postpartum cardiomyopathy, tobacco abuse, IV drug use presenting with acute respiratory failure with hypoxia. Patient reports approximate one month of progressive shortness of breath as well as subjective fevers and chills at home. Is originally from [place]. Relocated to the area recently. 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 Also reports a long-standing history of tobacco abuse. History of IV heroin use. Patient states she quit using approximately 1 month ago. Denies any other illicit drug use. Positive wheezing. Positive cough. Denies any lower extremity swelling. Progressively worsening DOE. Presented to the ER temperature 97.7, heart rate in the 60s to 80s, respirations and intense 20s, blood pressure in the 130s to 140s. Satting 92% on 2 L. However, desats into the mid 80s with ambulation. White blood cell count 10.8, hemoglobin 12.9, creatinine 0.6, potassium 3.1. CT NGO negative for PE. However does show moderate cardiomegaly bronchial wall thickening which may reflect bronchitis or edema. Positive symmetric hilar lymphadenopathy which could be infectious or inflammatory. Moderates emphysema. 5 mm left apical sub-solid pulmonary nodule. BNP 555. EKG sinus rhythm with LVH.  Cardiology asked to consult due to concern of possible endocarditis as well as CHF with history of post partum DCM.  She denies any chest pressure but has had some needle like sharp tingling pain in her chest.  She complains of LE edema for several days but has none on exam today.  She does not follow a low sodium diet.  She has had a nonproductive cough and subjective feeling of hot and cold but has not taken her temperature.       PMH:   Past Medical  History  Diagnosis Date  . Cardiomyopathy 2011  . Drug abuse   . Hypertension      PSH:  History reviewed. No pertinent past surgical history.  Allergies:  Review of patient's allergies indicates no known allergies. Prior to Admit Meds:   (Not in a hospital admission) Fam HX:   No family history on file. Social HX:    History   Social History  . Marital Status: Single    Spouse Name: N/A  . Number of Children: N/A  . Years of Education: N/A   Occupational History  . Not on file.   Social History Main Topics  . Smoking status: Current Every Day Smoker -- 0.50 packs/day  . Smokeless tobacco: Not on file  . Alcohol Use: No  . Drug Use: No  . Sexual Activity: Not on file   Other Topics Concern  . Not on file   Social History Narrative     ROS:  All 11 ROS were addressed and are negative except what is stated in the HPI  Physical Exam: Blood pressure 159/94, pulse 88, temperature 97.7 F (36.5 C), temperature source Oral, resp. rate 28, last menstrual period 10/11/2013, SpO2 94 %.    General: Well developed, well nourished, in no acute distress Head: Eyes PERRLA, No xanthomas.   Normal cephalic and atramatic  Lungs:   Clear bilaterally to auscultation and percussion. Heart:   HRRR S1 S2 Pulses are 2+ & equal.            No carotid bruit. No JVD.  No abdominal bruits. No femoral bruits. Abdomen: Bowel sounds are positive,  abdomen soft and non-tender without masses  Extremities:   No clubbing, cyanosis or edema.  DP +1 Neuro: Alert and oriented X 3. Psych:  Good affect, responds appropriately    Labs:   Lab Results  Component Value Date   WBC 9.9 03/30/2014   HGB 12.4 03/30/2014   HCT 37.4 03/30/2014   MCV 88.0 03/30/2014   PLT 152 03/30/2014    Recent Labs Lab 03/30/14 0515  NA 140  K 3.6  CL 105  CO2 25  BUN 6  CREATININE 0.46*  CALCIUM 8.8  PROT 6.8  BILITOT 0.5  ALKPHOS 66  ALT 14  AST 17  GLUCOSE 103*   No results found for: PTT No  results found for: INR, PROTIME Lab Results  Component Value Date   TROPONINI <0.03 03/30/2014    No results found for: CHOL No results found for: HDL No results found for: LDLCALC No results found for: TRIG No results found for: CHOLHDL No results found for: LDLDIRECT    Radiology:  Dg Chest 2 View  03/29/2014   CLINICAL DATA:  Shortness of breath, dry cough.  Smoker.  EXAM: CHEST  2 VIEW  COMPARISON:  None.  FINDINGS: Heart size enlarged. Aortic arch with mild atherosclerosis. Bihilar fullness. Right greater than left lung base opacities. No pleural effusion or pneumothorax. No acute osseous finding.  IMPRESSION: Heart size enlarged.  Bihilar fullness may reflect vascular congestion. Is there outside prior imaging to document stability? If not, consider chest CT to exclude adenopathy.  Lung base opacities may reflect atelectasis, scarring, and/or pneumonia.   Electronically Signed   By: Jearld Lesch M.D.   On: 03/29/2014 19:07   Ct Angio Chest Pe W/cm &/or Wo Cm  03/29/2014   CLINICAL DATA:  Shortness of breath for 3 days wheezing for 2 days with chest tightness. History of cardiomyopathy.  EXAM: CT ANGIOGRAPHY CHEST WITH CONTRAST  TECHNIQUE: Multidetector CT imaging of the chest was performed using the standard protocol during bolus administration of intravenous contrast. Multiplanar CT image reconstructions and MIPs were obtained to evaluate the vascular anatomy.  CONTRAST:  OMNIPAQUE IOHEXOL 350 MG/ML SOLN  COMPARISON:  Chest radiograph March 29, 2014 at 1849 hours  FINDINGS: PULMONARY ARTERY: Adequate contrast opacification of the pulmonary artery's. Main pulmonary artery is not enlarged. No pulmonary arterial filling defects to the level of the subsegmental branches.  MEDIASTINUM: Heart is moderately enlarged. No RIGHT heart strain. No pericardial fluid collections contrast refluxing into the inferior vena cava. Thoracic aorta is normal course and caliber, unremarkable. Hilar  lymphadenopathy bilaterally, borderline subcarinal lymphadenopathy.  LUNGS: Tracheobronchial tree is patent, no pneumothorax. Peribronchial wall thickening. Moderate apparent centrilobular emphysema. Patchy consolidation in the RIGHT greater than LEFT lung bases with small RIGHT pleural effusion. 5 mm sub solid pulmonary nodule LEFT lung apex. Mild paraseptal emphysema.  SOFT TISSUES AND OSSEOUS STRUCTURES: Included view of the abdomen is unremarkable. Visualized soft tissues and included osseous structures appear normal.  Review of the MIP images confirms the above findings.  IMPRESSION: No acute pulmonary embolism.  Moderate cardiomegaly. Bronchial wall thickening may refer reflect bronchitis or pulmonary edema with small RIGHT pleural effusion. Patchy presumed atelectasis in the lung bases.  Symmetric hilar lymphadenopathy, which could be infectious or inflammatory, recommend follow-up.  Moderate centrilobular and to lesser extent paraseptal emphysema.  5 mm LEFT apical sub solid pulmonary nodule. Initial follow-up by chest CT without contrast is recommended in 3 months to confirm persistence. This recommendation follows the consensus  statement: Recommendations for the Management of Subsolid Pulmonary Nodules Detected at CT: A Statement from the Fleischner Society as published in Radiology 2013; 266:304-317.   Electronically Signed   By: Awilda Metro   On: 03/29/2014 22:27    EKG:  NSR with LVH and repolarization changes, prolonged QTc  ASSESSMENT/PLAN:  1.  Acute respiratory distress most likely multifactorial from acute bronchitis and possible pulmonary edema.  BNP is mildly elevated and can be elevated in acute pulmonary disease. Chest xray suggestive of some edema.  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  She has no LE edema.  Agree with broad spectrum antibiotics as well as IV Lasix. 2.  Acute CHF in setting of history of post partum DCM - EF unknown.  She has no LE edema on exam and is not  moving much air on lung exam.  Her BNP is mildly elevated with chest xray suggestive of some edema.  Will start on IV Lasix. 3.  Subjective fever and chills but she never took her temperature.  She is afebrile at present.  Blood cultures pending.  This is most likely secondary to acute respiratory infection with probable bronchitis.  Her WBC is normal.  With no documented fevers and normal WBC would hold off on TEE (history of heroin abuse) as recommended by resident on initial H&P.  Would get 2D echo first and check a sed rate and CRP.  Would not pursue TEE unless echo suggest valvular heart disease and blood cultures positive.  Endocarditis is a clinical diagnosis with positive blood cultures which thus far she does not have. 4.  Post partum DCM - will get echo to assess EF     Quintella Reichert, MD  03/30/2014  9:39 AM

## 2014-03-30 NOTE — ED Notes (Signed)
Spoke to Hospitalist, will be down to see pt. Verbal order received for ABG. Respiratory at bedside. Pt not tolerating Bi-Pap, too anxious. MD Belfi states to hold ativan at this time until Hospitalist sees pt.

## 2014-03-30 NOTE — ED Notes (Signed)
Respiratory at bedside.

## 2014-03-30 NOTE — Progress Notes (Signed)
Rt placed  BIPAP on pt after having ativan. Pt doing well at this time.

## 2014-03-30 NOTE — ED Notes (Signed)
Call to give report, receiving RN unavailable at the time. Will attempt [id number].

## 2014-03-30 NOTE — ED Notes (Signed)
Spoke to Hospitalist, states she will change orders for pt to be placed in Step Down.

## 2014-03-30 NOTE — ED Notes (Signed)
Hospitalist at bedside speaking with pt. Will attempt bi-pap again after the ativan. RT Made aware.

## 2014-03-30 NOTE — ED Notes (Signed)
Pt c/o shortness of breath. On 6L [place]. Attempted to page MD without success. Secretary paging cardiology. Spoke with RT, will be down to evaluate.

## 2014-03-30 NOTE — ED Notes (Signed)
MD Belfi in to see pt, recommending portable chest and bi-pap. Respiratory made aware.

## 2014-03-30 NOTE — Progress Notes (Signed)
Bipap CK and neb given at his time pt doing well.

## 2014-03-30 NOTE — Progress Notes (Signed)
Pt seen, currently on 5lnc, HR87, rr17, spo2 99%.  No increased wob or respiratory distress noted or voiced by pt.  Bipap not indicated at this time, but in room on standby.  RT will continue to monitor pt.

## 2014-03-30 NOTE — ED Notes (Signed)
AC Joe notified that Hospitalist not returning page. Will contact Hospitalist and have her come down.

## 2014-03-30 NOTE — Progress Notes (Signed)
Pt asleep, no distress noted.  Pt currently on 4lnc, HR76, rr15, spo2 98%.  Bipap not indicated at this time, but remains in room if needed.  RT will continue to monitor pt.

## 2014-03-30 NOTE — Progress Notes (Signed)
Rt called to place pt on BIPAP for increase WOB. Pt could not tolerate BIPAP at 8/4 50%. ABG was order pending at this time. RN and MD at bedside.

## 2014-03-30 NOTE — ED Notes (Signed)
Bed: WA31 Expected date:  Expected time:  Means of arrival:  Comments: 

## 2014-03-31 ENCOUNTER — Encounter (HOSPITAL_COMMUNITY): Payer: Self-pay | Admitting: *Deleted

## 2014-03-31 DIAGNOSIS — R072 Precordial pain: Secondary | ICD-10-CM

## 2014-03-31 DIAGNOSIS — I509 Heart failure, unspecified: Secondary | ICD-10-CM

## 2014-03-31 DIAGNOSIS — I517 Cardiomegaly: Secondary | ICD-10-CM

## 2014-03-31 DIAGNOSIS — I5021 Acute systolic (congestive) heart failure: Secondary | ICD-10-CM

## 2014-03-31 DIAGNOSIS — J9601 Acute respiratory failure with hypoxia: Secondary | ICD-10-CM

## 2014-03-31 DIAGNOSIS — I10 Essential (primary) hypertension: Secondary | ICD-10-CM

## 2014-03-31 LAB — CBC WITH DIFFERENTIAL/PLATELET
BASOS PCT: 0 % (ref 0–1)
Basophils Absolute: 0 10*3/uL (ref 0.0–0.1)
Eosinophils Absolute: 0 10*3/uL (ref 0.0–0.7)
Eosinophils Relative: 0 % (ref 0–5)
HCT: 41.6 % (ref 36.0–46.0)
Hemoglobin: 13.6 g/dL (ref 12.0–15.0)
Lymphocytes Relative: 10 % — ABNORMAL LOW (ref 12–46)
Lymphs Abs: 2.4 10*3/uL (ref 0.7–4.0)
MCH: 29 pg (ref 26.0–34.0)
MCHC: 32.7 g/dL (ref 30.0–36.0)
MCV: 88.7 fL (ref 78.0–100.0)
MONOS PCT: 5 % (ref 3–12)
Monocytes Absolute: 1.2 10*3/uL — ABNORMAL HIGH (ref 0.1–1.0)
NEUTROS PCT: 85 % — AB (ref 43–77)
Neutro Abs: 19.9 10*3/uL — ABNORMAL HIGH (ref 1.7–7.7)
Platelets: 189 10*3/uL (ref 150–400)
RBC: 4.69 MIL/uL (ref 3.87–5.11)
RDW: 13.5 % (ref 11.5–15.5)
WBC: 23.5 10*3/uL — ABNORMAL HIGH (ref 4.0–10.5)

## 2014-03-31 LAB — COMPREHENSIVE METABOLIC PANEL
ALBUMIN: 3.6 g/dL (ref 3.5–5.2)
ALT: 18 U/L (ref 0–35)
ANION GAP: 9 (ref 5–15)
AST: 21 U/L (ref 0–37)
Alkaline Phosphatase: 70 U/L (ref 39–117)
BUN: 11 mg/dL (ref 6–23)
CO2: 27 mmol/L (ref 19–32)
Calcium: 9.4 mg/dL (ref 8.4–10.5)
Chloride: 103 mmol/L (ref 96–112)
Creatinine, Ser: 0.49 mg/dL — ABNORMAL LOW (ref 0.50–1.10)
GLUCOSE: 142 mg/dL — AB (ref 70–99)
Potassium: 3.9 mmol/L (ref 3.5–5.1)
Sodium: 139 mmol/L (ref 135–145)
TOTAL PROTEIN: 7.7 g/dL (ref 6.0–8.3)
Total Bilirubin: 0.5 mg/dL (ref 0.3–1.2)

## 2014-03-31 LAB — PROCALCITONIN

## 2014-03-31 MED ORDER — PNEUMOCOCCAL VAC POLYVALENT 25 MCG/0.5ML IJ INJ
0.5000 mL | INJECTION | INTRAMUSCULAR | Status: DC
  Filled 2014-03-31 (×4): qty 0.5

## 2014-03-31 MED ORDER — ZOLPIDEM TARTRATE 5 MG PO TABS
5.0000 mg | ORAL_TABLET | Freq: Once | ORAL | Status: AC
  Administered 2014-03-31: 5 mg via ORAL
  Filled 2014-03-31: qty 1

## 2014-03-31 NOTE — Progress Notes (Signed)
SUBJECTIVE:  Denies SOB this am  OBJECTIVE:   Vitals:   Filed Vitals:   03/31/14 0600 03/31/14 0700 03/31/14 0756 03/31/14 0824  BP: 133/85 136/76    Pulse: 72 69    Temp:    98.3 F (36.8 C)  TempSrc:    Oral  Resp: 13 12    Height:      Weight:      SpO2: 97% 98% 100%    I&O's:   Intake/Output Summary (Last 24 hours) at 03/31/14 6045 Last data filed at 03/30/14 2233  Gross per 24 hour  Intake    853 ml  Output   2900 ml  Net  -2047 ml   TELEMETRY: Reviewed telemetry pt in NSR:     PHYSICAL EXAM General: Well developed, well nourished, in no acute distress Head: Eyes PERRLA, No xanthomas.   Normal cephalic and atramatic  Lungs:   Clear bilaterally to auscultation and percussion. Heart:   HRRR S1 S2 Pulses are 2+ & equal. Abdomen: Bowel sounds are positive, abdomen soft and non-tender without masses  Extremities:   No clubbing, cyanosis or edema.  DP +1 Neuro: Alert and oriented X 3. Psych:  Good affect, responds appropriately   LABS: Basic Metabolic Panel:  Recent Labs  40/98/11 0154 03/30/14 0515 03/31/14 0346  NA  --  140 139  K  --  3.6 3.9  CL  --  105 103  CO2  --  25 27  GLUCOSE  --  103* 142*  BUN  --  6 11  CREATININE 0.53 0.46* 0.49*  CALCIUM  --  8.8 9.4  MG 1.9  --   --    Liver Function Tests:  Recent Labs  03/30/14 0515 03/31/14 0346  AST 17 21  ALT 14 18  ALKPHOS 66 70  BILITOT 0.5 0.5  PROT 6.8 7.7  ALBUMIN 3.4* 3.6   No results for input(s): LIPASE, AMYLASE in the last 72 hours. CBC:  Recent Labs  03/30/14 0515 03/31/14 0346  WBC 9.9 23.5*  NEUTROABS 4.3 19.9*  HGB 12.4 13.6  HCT 37.4 41.6  MCV 88.0 88.7  PLT 152 189   Cardiac Enzymes:  Recent Labs  03/30/14 0154 03/30/14 1030  TROPONINI <0.03 <0.03   BNP: Invalid input(s): POCBNP D-Dimer: No results for input(s): DDIMER in the last 72 hours. Hemoglobin A1C: No results for input(s): HGBA1C in the last 72 hours. Fasting Lipid Panel: No results for  input(s): CHOL, HDL, LDLCALC, TRIG, CHOLHDL, LDLDIRECT in the last 72 hours. Thyroid Function Tests: No results for input(s): TSH, T4TOTAL, T3FREE, THYROIDAB in the last 72 hours.  Invalid input(s): FREET3 Anemia Panel: No results for input(s): VITAMINB12, FOLATE, FERRITIN, TIBC, IRON, RETICCTPCT in the last 72 hours. Coag Panel:   No results found for: INR, PROTIME  RADIOLOGY: Dg Chest 2 View  03/29/2014   CLINICAL DATA:  Shortness of breath, dry cough.  Smoker.  EXAM: CHEST  2 VIEW  COMPARISON:  None.  FINDINGS: Heart size enlarged. Aortic arch with mild atherosclerosis. Bihilar fullness. Right greater than left lung base opacities. No pleural effusion or pneumothorax. No acute osseous finding.  IMPRESSION: Heart size enlarged.  Bihilar fullness may reflect vascular congestion. Is there outside prior imaging to document stability? If not, consider chest CT to exclude adenopathy.  Lung base opacities may reflect atelectasis, scarring, and/or pneumonia.   Electronically Signed   By: Jearld Lesch M.D.   On: 03/29/2014 19:07   Ct Angio Chest Pe  W/cm &/or Wo Cm  03/29/2014   CLINICAL DATA:  Shortness of breath for 3 days wheezing for 2 days with chest tightness. History of cardiomyopathy.  EXAM: CT ANGIOGRAPHY CHEST WITH CONTRAST  TECHNIQUE: Multidetector CT imaging of the chest was performed using the standard protocol during bolus administration of intravenous contrast. Multiplanar CT image reconstructions and MIPs were obtained to evaluate the vascular anatomy.  CONTRAST:  OMNIPAQUE IOHEXOL 350 MG/ML SOLN  COMPARISON:  Chest radiograph March 29, 2014 at 1849 hours  FINDINGS: PULMONARY ARTERY: Adequate contrast opacification of the pulmonary artery's. Main pulmonary artery is not enlarged. No pulmonary arterial filling defects to the level of the subsegmental branches.  MEDIASTINUM: Heart is moderately enlarged. No RIGHT heart strain. No pericardial fluid collections contrast refluxing into  the inferior vena cava. Thoracic aorta is normal course and caliber, unremarkable. Hilar lymphadenopathy bilaterally, borderline subcarinal lymphadenopathy.  LUNGS: Tracheobronchial tree is patent, no pneumothorax. Peribronchial wall thickening. Moderate apparent centrilobular emphysema. Patchy consolidation in the RIGHT greater than LEFT lung bases with small RIGHT pleural effusion. 5 mm sub solid pulmonary nodule LEFT lung apex. Mild paraseptal emphysema.  SOFT TISSUES AND OSSEOUS STRUCTURES: Included view of the abdomen is unremarkable. Visualized soft tissues and included osseous structures appear normal.  Review of the MIP images confirms the above findings.  IMPRESSION: No acute pulmonary embolism.  Moderate cardiomegaly. Bronchial wall thickening may refer reflect bronchitis or pulmonary edema with small RIGHT pleural effusion. Patchy presumed atelectasis in the lung bases.  Symmetric hilar lymphadenopathy, which could be infectious or inflammatory, recommend follow-up.  Moderate centrilobular and to lesser extent paraseptal emphysema.  5 mm LEFT apical sub solid pulmonary nodule. Initial follow-up by chest CT without contrast is recommended in 3 months to confirm persistence. This recommendation follows the consensus statement: Recommendations for the Management of Subsolid Pulmonary Nodules Detected at CT: A Statement from the Fleischner Society as published in Radiology 2013; 266:304-317.   Electronically Signed   By: Awilda Metro   On: 03/29/2014 22:27   Dg Chest Port 1 View  03/30/2014   CLINICAL DATA:  Pt unable/unwilling to hold still or sit back for imaging, pt is in "Psych ED," Best image obtainable at this time. Low O2 sats  EXAM: PORTABLE CHEST - 1 VIEW  COMPARISON:  03/29/2014  FINDINGS: Cardiac silhouette is mildly enlarged. No mediastinal or hilar masses. There is increased opacity in the perihilar and lower lung zones when compared to the previous day's study. This may reflect pulmonary  edema, atelectasis or a combination. No pneumothorax.  IMPRESSION: Worsened lung aeration with increased perihilar and lower lung zone opacity. This may be due to atelectasis, pulmonary edema or infiltrate, or a combination.   Electronically Signed   By: Amie Portland M.D.   On: 03/30/2014 09:52   ASSESSMENT/PLAN:  1. Acute respiratory distress most likely multifactorial from acute bronchitis and possible pulmonary edema. BNP is mildly elevated and can be elevated in acute pulmonary disease. Chest xray suggestive of some edema. 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 She has no LE edema. Agree with broad spectrum antibiotics as well as IV Lasix.  2. Acute CHF in setting of history of post partum DCM - EF unknown. She has no LE edema on exam and is not moving much air on lung exam. Her BNP is mildly elevated with chest xray suggestive of some edema. She is net neg 2.3L on IV Lasix.  Continue IV Lasix/BB/ARB 3. Post partum DCM - will get  echo to assess EF    Quintella ReichertURNER,TRACI R, MD  03/31/2014  9:17 AM

## 2014-03-31 NOTE — Progress Notes (Signed)
  Echocardiogram 2D Echocardiogram has been performed.  Aris EvertsRix, Halim Surrette A 03/31/2014, 4:18 PM

## 2014-03-31 NOTE — Progress Notes (Signed)
PROGRESS NOTE  Julia Payne ZOX:[id number] DOB: 05/31/1970 DOA: 03/29/2014 PCP: No primary care provider on file.  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 The patient denied any fevers, chills, nausea, vomiting, diarrhea, abdominal pain, dysuria, hemoptysis. The patient continues to smoke 1 pack per day (20 pack years), but denies any illegal drug use. [facility]. The patient was admitted for acute on chronic respiratory failure secondary to pulmonary edema. There was some concern for an infectious component. The patient was also started on intravenous steroids as she was having significant bronchospasm. The patient was started on vancomycin and Zosyn after blood cultures were obtained.  Assessment/Plan: Acute respiratory failure -Presently stable on 4 L -Secondary to CHF -Question component of infection -Continue to wean oxygen for saturation greater than 92% -Bronchodilators when necessary -CT angio chest--negative for pulmonary embolus but showed emphysema and patchy consolidation right greater than left lower lobes Acute on chronic CHF/postpartum cardiomyopathy history -Await echocardiogram -Continue intravenous furosemide -Although the patient does not have significant peripheral edema, still has JVD noted  -Clinically improving with diuresis  -neg 2.2 L for the admission -daily weights -Continue carvedilol  -Continue ARB -BNP 555 on the day of admission Leukocytosis  -Likely due to steroids  -WBC 10.8 on the day of admission  -Chek procalcitonin  -Discontinue Solu-Medrol  -Continue antibiotics for now pending culture data and clinical response; however plan to de-escalate in next 24 hrs Atypical chest pain  -Troponins negative 3  - EKG showed  T-wave inversion II, III, aVF  -CT angiogram chest negative for pulmonary embolus Depression  -Continue Cymbalta  History of heroin abuse  -Continue methadone  left apex lung nodule   -Will need outpatient surveillance    Family Communication:   Pt at beside Disposition Plan: transfer to tele       Procedures/Studies: Dg Chest 2 View  03/29/2014   CLINICAL DATA:  Shortness of breath, dry cough.  Smoker.  EXAM: CHEST  2 VIEW  COMPARISON:  None.  FINDINGS: Heart size enlarged. Aortic arch with mild atherosclerosis. Bihilar fullness. Right greater than left lung base opacities. No pleural effusion or pneumothorax. No acute osseous finding.  IMPRESSION: Heart size enlarged.  Bihilar fullness may reflect vascular congestion. Is there outside prior imaging to document stability? If not, consider chest CT to exclude adenopathy.  Lung base opacities may reflect atelectasis, scarring, and/or pneumonia.   Electronically Signed   By: Jearld LeschAndrew  DelGaizo M.D.   On: 03/29/2014 19:07   Ct Angio Chest Pe W/cm &/or Wo Cm  03/29/2014   CLINICAL DATA:  Shortness of breath for 3 days wheezing for 2 days with chest tightness. History of cardiomyopathy.  EXAM: CT ANGIOGRAPHY CHEST WITH CONTRAST  TECHNIQUE: Multidetector CT imaging of the chest was performed using the standard protocol during bolus administration of intravenous contrast. Multiplanar CT image reconstructions and MIPs were obtained to evaluate the vascular anatomy.  CONTRAST:  100mL OMNIPAQUE IOHEXOL 350 MG/ML SOLN  COMPARISON:  Chest radiograph March 29, 2014 at 1849 hours  FINDINGS: PULMONARY ARTERY: Adequate contrast opacification of the pulmonary artery's. Main pulmonary artery is not enlarged. No pulmonary arterial filling defects to the level of the subsegmental branches.  MEDIASTINUM: Heart is moderately enlarged. No RIGHT heart strain. No pericardial fluid collections contrast refluxing into  the inferior vena cava. Thoracic aorta is normal  course and caliber, unremarkable. Hilar lymphadenopathy bilaterally, borderline subcarinal lymphadenopathy.  LUNGS: Tracheobronchial tree is patent, no pneumothorax. Peribronchial wall thickening. Moderate apparent centrilobular emphysema. Patchy consolidation in the RIGHT greater than LEFT lung bases with small RIGHT pleural effusion. 5 mm sub solid pulmonary nodule LEFT lung apex. Mild paraseptal emphysema.  SOFT TISSUES AND OSSEOUS STRUCTURES: Included view of the abdomen is unremarkable. Visualized soft tissues and included osseous structures appear normal.  Review of the MIP images confirms the above findings.  IMPRESSION: No acute pulmonary embolism.  Moderate cardiomegaly. Bronchial wall thickening may refer reflect bronchitis or pulmonary edema with small RIGHT pleural effusion. Patchy presumed atelectasis in the lung bases.  Symmetric hilar lymphadenopathy, which could be infectious or inflammatory, recommend follow-up.  Moderate centrilobular and to lesser extent paraseptal emphysema.  5 mm LEFT apical sub solid pulmonary nodule. Initial follow-up by chest CT without contrast is recommended in 3 months to confirm persistence. This recommendation follows the consensus statement: Recommendations for the Management of Subsolid Pulmonary Nodules Detected at CT: A Statement from the Fleischner Society as published in Radiology 2013; 266:304-317.   Electronically Signed   By: Awilda Metro   On: 03/29/2014 22:27   Dg Chest Port 1 View  03/30/2014   CLINICAL DATA:  Pt unable/unwilling to hold still or sit back for imaging, pt is in "Psych ED," Best image obtainable at this time. Low O2 sats  EXAM: PORTABLE CHEST - 1 VIEW  COMPARISON:  03/29/2014  FINDINGS: Cardiac silhouette is mildly enlarged. No mediastinal or hilar masses. There is increased opacity in the perihilar and lower lung zones when compared to the previous day's study. This may reflect pulmonary edema, atelectasis or a combination. No  pneumothorax.  IMPRESSION: Worsened lung aeration with increased perihilar and lower lung zone opacity. This may be due to atelectasis, pulmonary edema or infiltrate, or a combination.   Electronically Signed   By: Amie Portland M.D.   On: 03/30/2014 09:52         Subjective:  patient denies fevers, chills, nausea, vomiting, diarrhea, abdominal pain, dysuria. She still has some shortness of breath and chest discomfort intermittently. Denies any dysuria, hematuria.   Objective: Filed Vitals:   03/31/14 0100 03/31/14 0200 03/31/14 0400 03/31/14 0500  BP: 153/74 135/72    Pulse: 75 69    Temp:   98.2 F (36.8 C)   TempSrc:   Oral   Resp: 14 13    Height:      Weight:    67.9 kg (149 lb 11.1 oz)  SpO2: 95% 96%      Intake/Output Summary (Last 24 hours) at 03/31/14 0743 Last data filed at 03/30/14 2233  Gross per 24 hour  Intake    853 ml  Output   3150 ml  Net  -2297 ml   Weight change:  Exam:   General:  Pt is alert, follows commands appropriately, not in acute distress  HEENT: No icterus, No thrush,[employer]/AT  Cardiovascular: RRR, S1/S2, no rubs, no gallops; +JVE  Respiratory: bibasilar crackles, right greater than left. No wheezing.   Abdomen: Soft/+BS, non tender, non distended, no guarding  Extremities: No edema, No lymphangitis, No petechiae, No rashes, no synovitis  Data Reviewed: Basic Metabolic Panel:  Recent Labs Lab 03/29/14 1812 03/30/14 0154 03/30/14 0515 03/31/14 0346  NA 139  --  140 139  K 3.1*  --  3.6 3.9  CL 104  --  105  103  CO2 26  --  25 27  GLUCOSE 134*  --  103* 142*  BUN 8  --  6 11  CREATININE 0.60 0.53 0.46* 0.49*  CALCIUM 8.8  --  8.8 9.4  MG  --  1.9  --   --    Liver Function Tests:  Recent Labs Lab 03/30/14 0515 03/31/14 0346  AST 17 21  ALT 14 18  ALKPHOS 66 70  BILITOT 0.5 0.5  PROT 6.8 7.7  ALBUMIN 3.4* 3.6   No results for input(s): LIPASE, AMYLASE in the last 168 hours. No results for input(s): AMMONIA in the  last 168 hours. CBC:  Recent Labs Lab 03/29/14 1812 03/30/14 0154 03/30/14 0515 03/31/14 0346  WBC 10.8* 10.0 9.9 23.5*  NEUTROABS  --   --  4.3 19.9*  HGB 12.9 12.4 12.4 13.6  HCT 39.2 37.7 37.4 41.6  MCV 88.3 87.9 88.0 88.7  PLT 174 172 152 189   Cardiac Enzymes:  Recent Labs Lab 03/30/14 0154 03/30/14 1030  TROPONINI <0.03 <0.03   BNP: Invalid input(s): POCBNP CBG: No results for input(s): GLUCAP in the last 168 hours.  Recent Results (from the past 240 hour(s))  Culture, blood (routine x 2)     Status: None (Preliminary result)   Collection Time: 03/30/14  1:02 AM  Result Value Ref Range Status   Specimen Description BLOOD BLOOD LEFT FOREARM  Final   Special Requests BOTTLES DRAWN AEROBIC AND ANAEROBIC 5CC  Final   Culture   Final           BLOOD CULTURE RECEIVED NO GROWTH TO DATE CULTURE WILL BE HELD FOR 5 DAYS BEFORE ISSUING A FINAL NEGATIVE REPORT Performed at [facility]    Report Status PENDING  Incomplete  Culture, blood (routine x 2)     Status: None (Preliminary result)   Collection Time: 03/30/14  1:52 AM  Result Value Ref Range Status   Specimen Description BLOOD RIGHT HAND  Final   Special Requests BOTTLES DRAWN AEROBIC AND ANAEROBIC 5CC  Final   Culture   Final           BLOOD CULTURE RECEIVED NO GROWTH TO DATE CULTURE WILL BE HELD FOR 5 DAYS BEFORE ISSUING A FINAL NEGATIVE REPORT Performed at [facility]    Report Status PENDING  Incomplete  MRSA PCR Screening     Status: None   Collection Time: 03/30/14  2:04 PM  Result Value Ref Range Status   MRSA by PCR NEGATIVE NEGATIVE Final    Comment:        The GeneXpert MRSA Assay (FDA approved for NASAL specimens only), is one component of a comprehensive MRSA colonization surveillance program. It is not intended to diagnose MRSA infection nor to guide or monitor treatment for MRSA infections.      Scheduled Meds: . carvedilol  12.5 mg Oral BID WC  . DULoxetine  60 mg  Oral Daily  . furosemide  40 mg Intravenous BID  . heparin  5,000 Units Subcutaneous 3 times per day  . ipratropium-albuterol  3 mL Nebulization TID  . irbesartan  150 mg Oral Daily  . methadone  70 mg Oral Daily  . nicotine  14 mg Transdermal Daily  . piperacillin-tazobactam (ZOSYN)  IV  3.375 g Intravenous Q8H  . sodium chloride  3 mL Intravenous Q12H  . vancomycin  1,000 mg Intravenous Q12H   Continuous Infusions:    Corrigan Kretschmer, DO  [facility] Pager  [phone]  If 7PM-7AM, please contact night-coverage [email or web address] Password [email or web address] 03/31/2014, 7:43 AM   LOS: 1 day

## 2014-03-31 NOTE — Progress Notes (Signed)
Patient transferred from ICU to 1438. Alert and oriented, denies pain but C/O of anxiety PRN ativan given, oriented patient to room/unit and reviewed plan of care with patient. Skin intact, no wound noted. Will continue to assess patient.

## 2014-04-01 DIAGNOSIS — R072 Precordial pain: Secondary | ICD-10-CM

## 2014-04-01 DIAGNOSIS — I5021 Acute systolic (congestive) heart failure: Secondary | ICD-10-CM | POA: Insufficient documentation

## 2014-04-01 LAB — CBC WITH DIFFERENTIAL/PLATELET
BASOS ABS: 0 10*3/uL (ref 0.0–0.1)
Basophils Relative: 0 % (ref 0–1)
EOS PCT: 0 % (ref 0–5)
Eosinophils Absolute: 0 10*3/uL (ref 0.0–0.7)
HEMATOCRIT: 42.3 % (ref 36.0–46.0)
HEMOGLOBIN: 13.7 g/dL (ref 12.0–15.0)
Lymphocytes Relative: 23 % (ref 12–46)
Lymphs Abs: 6.5 10*3/uL — ABNORMAL HIGH (ref 0.7–4.0)
MCH: 28.9 pg (ref 26.0–34.0)
MCHC: 32.4 g/dL (ref 30.0–36.0)
MCV: 89.2 fL (ref 78.0–100.0)
Monocytes Absolute: 2 10*3/uL — ABNORMAL HIGH (ref 0.1–1.0)
Monocytes Relative: 7 % (ref 3–12)
Neutro Abs: 19.9 10*3/uL — ABNORMAL HIGH (ref 1.7–7.7)
Neutrophils Relative %: 70 % (ref 43–77)
Platelets: 189 10*3/uL (ref 150–400)
RBC: 4.74 MIL/uL (ref 3.87–5.11)
RDW: 13.8 % (ref 11.5–15.5)
WBC: 28.4 10*3/uL — ABNORMAL HIGH (ref 4.0–10.5)

## 2014-04-01 LAB — COMPREHENSIVE METABOLIC PANEL
ALT: 38 U/L — AB (ref 0–35)
AST: 40 U/L — ABNORMAL HIGH (ref 0–37)
Albumin: 3.5 g/dL (ref 3.5–5.2)
Alkaline Phosphatase: 65 U/L (ref 39–117)
Anion gap: 12 (ref 5–15)
BUN: 13 mg/dL (ref 6–23)
CO2: 29 mmol/L (ref 19–32)
Calcium: 8.8 mg/dL (ref 8.4–10.5)
Chloride: 96 mmol/L (ref 96–112)
Creatinine, Ser: 0.63 mg/dL (ref 0.50–1.10)
GFR calc non Af Amer: >90 mL/min (ref >90)
GLUCOSE: 94 mg/dL (ref 70–99)
Potassium: 3.1 mmol/L — ABNORMAL LOW (ref 3.5–5.1)
SODIUM: 137 mmol/L (ref 135–145)
TOTAL PROTEIN: 7.2 g/dL (ref 6.0–8.3)
Total Bilirubin: 0.5 mg/dL (ref 0.3–1.2)

## 2014-04-01 MED ORDER — POTASSIUM CHLORIDE CRYS ER 20 MEQ PO TBCR
40.0000 meq | EXTENDED_RELEASE_TABLET | Freq: Two times a day (BID) | ORAL | Status: DC
  Administered 2014-04-01 – 2014-04-02 (×3): 40 meq via ORAL
  Filled 2014-04-01 (×3): qty 2

## 2014-04-01 MED ORDER — IRBESARTAN 150 MG PO TABS
300.0000 mg | ORAL_TABLET | Freq: Every day | ORAL | Status: DC
  Administered 2014-04-01 – 2014-04-02 (×2): 300 mg via ORAL
  Filled 2014-04-01 (×2): qty 2

## 2014-04-01 MED ORDER — FUROSEMIDE 40 MG PO TABS
40.0000 mg | ORAL_TABLET | Freq: Every day | ORAL | Status: DC
Start: 2014-04-01 — End: 2014-04-01

## 2014-04-01 MED ORDER — FUROSEMIDE 40 MG PO TABS
40.0000 mg | ORAL_TABLET | Freq: Two times a day (BID) | ORAL | Status: DC
  Administered 2014-04-02: 40 mg via ORAL
  Filled 2014-04-01: qty 1

## 2014-04-01 MED ORDER — TRAMADOL HCL 50 MG PO TABS
50.0000 mg | ORAL_TABLET | Freq: Once | ORAL | Status: AC
  Administered 2014-04-02: 50 mg via ORAL
  Filled 2014-04-01: qty 1

## 2014-04-01 NOTE — Progress Notes (Signed)
Patient had 6runs of V-tach, patient denies any chest pain/distress, vitals, 155/79, 61, 95%-RA, Dr. Arbutus Leasat notified, no new order given, will continue to assess patient.

## 2014-04-01 NOTE — Progress Notes (Signed)
PROGRESS NOTE  Julia Payne ZOX:[id number] DOB: [birth year] DOA: 03/29/2014 PCP: No primary care provider on file.   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 The patient denied any fevers, chills, nausea, vomiting, diarrhea, abdominal pain, dysuria, hemoptysis. The patient continues to smoke 1 pack per day (20 pack years), but denies any illegal drug use. She receives methadone daily at [facility]. The patient was admitted for acute on chronic respiratory failure secondary to pulmonary edema. There was some concern for an infectious component. The patient was also started on intravenous steroids as she was having significant bronchospasm. The patient was started on vancomycin and Zosyn after blood cultures were obtained.  Cultures have remained negative and procalcitonin was neg; therefore, abx have been de-escalated  Assessment/Plan: Acute respiratory failure -initially on NRB-->now stable on RA -Secondary to CHF -Question component of infection -Continue to wean oxygen for saturation greater than 92% -Bronchodilators when necessary -CT angio chest--negative for pulmonary embolus but showed emphysema and patchy consolidation right greater than left lower lobes Acute on chronic CHF/postpartum cardiomyopathy history -Await echocardiogram -Continue intravenous furosemide through today--po lasix on 3/22 -Clinically improving with diuresis  -neg 3.3 L for the admission -04/01/2014 echo EF 30-35%, diffuse HK -daily weights -Continue carvedilol  -Continue ARB--dose increased to  -BNP 555 on the day of admission Leukocytosis  -Likely due to steroids--d/ced after dose on 03/31/14 -WBC 10.8 on the day of admission  -Chek procalcitonin--neg  -Discontinue Solu-Medrol  -d/c vancomycin -d/c  zosyn -Levofloxacin x 3 more days  Atypical chest pain  -Troponins negative 3  - EKG showed T-wave inversion II, III, aVF  -CT angiogram chest negative for pulmonary embolus Depression  -Continue Cymbalta  History of heroin abuse  -Continue methadone  left apex lung nodule  -Will need outpatient surveillance   Family Communication: Pt at beside Disposition Plan: home 3/22 if ok with cardiology      Procedures/Studies: Dg Chest 2 View  03/29/2014   CLINICAL DATA:  Shortness of breath, dry cough.  Smoker.  EXAM: CHEST  2 VIEW  COMPARISON:  None.  FINDINGS: Heart size enlarged. Aortic arch with mild atherosclerosis. Bihilar fullness. Right greater than left lung base opacities. No pleural effusion or pneumothorax. No acute osseous finding.  IMPRESSION: Heart size enlarged.  Bihilar fullness may reflect vascular congestion. Is there outside prior imaging to document stability? If not, consider chest CT to exclude adenopathy.  Lung base opacities may reflect atelectasis, scarring, and/or pneumonia.   Electronically Signed   By: Jearld Lesch M.D.   On: 03/29/2014 19:07   Ct Angio Chest Pe W/cm &/or Wo Cm  03/29/2014   CLINICAL DATA:  Shortness of breath for 3 days wheezing for 2 days with chest tightness. History of cardiomyopathy.  EXAM: CT ANGIOGRAPHY CHEST WITH CONTRAST  TECHNIQUE: Multidetector CT imaging of the chest was performed using the standard protocol during bolus administration of intravenous contrast. Multiplanar CT image reconstructions and MIPs were obtained to evaluate the vascular anatomy.  CONTRAST:  OMNIPAQUE IOHEXOL 350 MG/ML SOLN  COMPARISON:  Chest radiograph March 29, 2014 at 1849 hours  FINDINGS: PULMONARY ARTERY: Adequate contrast opacification of the pulmonary artery's. Main pulmonary artery is not enlarged. No pulmonary arterial filling defects to the level of the subsegmental branches.  MEDIASTINUM: Heart is moderately enlarged. No RIGHT  heart  strain. No pericardial fluid collections contrast refluxing into the inferior vena cava. Thoracic aorta is normal course and caliber, unremarkable. Hilar lymphadenopathy bilaterally, borderline subcarinal lymphadenopathy.  LUNGS: Tracheobronchial tree is patent, no pneumothorax. Peribronchial wall thickening. Moderate apparent centrilobular emphysema. Patchy consolidation in the RIGHT greater than LEFT lung bases with small RIGHT pleural effusion. 5 mm sub solid pulmonary nodule LEFT lung apex. Mild paraseptal emphysema.  SOFT TISSUES AND OSSEOUS STRUCTURES: Included view of the abdomen is unremarkable. Visualized soft tissues and included osseous structures appear normal.  Review of the MIP images confirms the above findings.  IMPRESSION: No acute pulmonary embolism.  Moderate cardiomegaly. Bronchial wall thickening may refer reflect bronchitis or pulmonary edema with small RIGHT pleural effusion. Patchy presumed atelectasis in the lung bases.  Symmetric hilar lymphadenopathy, which could be infectious or inflammatory, recommend follow-up.  Moderate centrilobular and to lesser extent paraseptal emphysema.  5 mm LEFT apical sub solid pulmonary nodule. Initial follow-up by chest CT without contrast is recommended in 3 months to confirm persistence. This recommendation follows the consensus statement: Recommendations for the Management of Subsolid Pulmonary Nodules Detected at CT: A Statement from the Fleischner Society as published in Radiology 2013; 266:304-317.   Electronically Signed   By: Awilda Metro   On: 03/29/2014 22:27   Dg Chest Port 1 View  03/30/2014   CLINICAL DATA:  Pt unable/unwilling to hold still or sit back for imaging, pt is in "Psych ED," Best image obtainable at this time. Low O2 sats  EXAM: PORTABLE CHEST - 1 VIEW  COMPARISON:  03/29/2014  FINDINGS: Cardiac silhouette is mildly enlarged. No mediastinal or hilar masses. There is increased opacity in the perihilar and lower lung zones when  compared to the previous day's study. This may reflect pulmonary edema, atelectasis or a combination. No pneumothorax.  IMPRESSION: Worsened lung aeration with increased perihilar and lower lung zone opacity. This may be due to atelectasis, pulmonary edema or infiltrate, or a combination.   Electronically Signed   By: Amie Portland M.D.   On: 03/30/2014 09:52         Subjective: Patient denies fevers, chills, headache, chest pain, dyspnea, nausea, vomiting, diarrhea, abdominal pain, dysuria, hematuria   Objective: Filed Vitals:   04/01/14 0819 04/01/14 0958 04/01/14 1312 04/01/14 1742  BP:  155/79 145/92 142/69  Pulse:  61 63 67  Temp:  98.1 F (36.7 C) 97.8 F (36.6 C)   TempSrc:  Oral Oral   Resp:      Height:      Weight:      SpO2: 97% 95% 98%     Intake/Output Summary (Last 24 hours) at 04/01/14 1843 Last data filed at 04/01/14 1743  Gross per 24 hour  Intake    990 ml  Output   2001 ml  Net  -1011 ml   Weight change: 0.079 kg (2.8 oz) Exam:   General:  Pt is alert, follows commands appropriately, not in acute distress  HEENT: No icterus, No thrush,  White/AT  Cardiovascular: RRR, S1/S2, no rubs, no gallops  Respiratory: Fine bibasilar crackles. No wheeze.  Abdomen: Soft/+BS, non tender, non distended, no guarding  Extremities: No edema, No lymphangitis, No petechiae, No rashes, no synovitis  Data Reviewed: Basic Metabolic Panel:  Recent Labs Lab 03/29/14 1812 03/30/14 0154 03/30/14 0515 03/31/14 0346 04/01/14 0555  NA 139  --  140 139 137  K 3.1*  --  3.6 3.9 3.1*  CL 104  --  105  103 96  CO2 26  --  GLUCOSE 134*  --  103* 142* 94  BUN 8  --  CREATININE 0.60 0.53 0.46* 0.49* 0.63  CALCIUM 8.8  --  8.8 9.4 8.8  MG  --  1.9  --   --   --    Liver Function Tests:  Recent Labs Lab 03/30/14 0515 03/31/14 0346 04/01/14 0555  AST 17 21 40*  ALT 14 18 38*  ALKPHOS 66 70 65  BILITOT 0.5 0.5 0.5  PROT 6.8 7.7 7.2  ALBUMIN  3.4* 3.6 3.5   No results for input(s): LIPASE, AMYLASE in the last 168 hours. No results for input(s): AMMONIA in the last 168 hours. CBC:  Recent Labs Lab 03/29/14 1812 03/30/14 0154 03/30/14 0515 03/31/14 0346 04/01/14 0555  WBC 10.8* 10.0 9.9 23.5* 28.4*  NEUTROABS  --   --  4.3 19.9* 19.9*  HGB 12.9 12.4 12.4 13.6 13.7  HCT 39.2 37.7 37.4 41.6 42.3  MCV 88.3 87.9 88.0 88.7 89.2  PLT 174 172 152 189 189   Cardiac Enzymes:  Recent Labs Lab 03/30/14 0154 03/30/14 1030  TROPONINI <0.03 <0.03   BNP: Invalid input(s): POCBNP CBG: No results for input(s): GLUCAP in the last 168 hours.  Recent Results (from the past 240 hour(s))  Culture, blood (routine x 2)     Status: None (Preliminary result)   Collection Time: 03/30/14  1:02 AM  Result Value Ref Range Status   Specimen Description BLOOD BLOOD LEFT FOREARM  Final   Special Requests BOTTLES DRAWN AEROBIC AND ANAEROBIC 5CC  Final   Culture   Final           BLOOD CULTURE RECEIVED NO GROWTH TO DATE CULTURE WILL BE HELD FOR 5 DAYS BEFORE ISSUING A FINAL NEGATIVE REPORT Performed at [facility]    Report Status PENDING  Incomplete  Culture, blood (routine x 2)     Status: None (Preliminary result)   Collection Time: 03/30/14  1:52 AM  Result Value Ref Range Status   Specimen Description BLOOD RIGHT HAND  Final   Special Requests BOTTLES DRAWN AEROBIC AND ANAEROBIC 5CC  Final   Culture   Final           BLOOD CULTURE RECEIVED NO GROWTH TO DATE CULTURE WILL BE HELD FOR 5 DAYS BEFORE ISSUING A FINAL NEGATIVE REPORT Performed at [facility]    Report Status PENDING  Incomplete  MRSA PCR Screening     Status: None   Collection Time: 03/30/14  2:04 PM  Result Value Ref Range Status   MRSA by PCR NEGATIVE NEGATIVE Final    Comment:        The GeneXpert MRSA Assay (FDA approved for NASAL specimens only), is one component of a comprehensive MRSA colonization surveillance program. It is not intended  to diagnose MRSA infection nor to guide or monitor treatment for MRSA infections.      Scheduled Meds: . carvedilol  12.5 mg Oral BID WC  . DULoxetine  60 mg Oral Daily  . [START ON 04/02/2014] furosemide  40 mg Oral BID  . heparin  5,000 Units Subcutaneous 3 times per day  . ipratropium-albuterol  3 mL Nebulization TID  . irbesartan  300 mg Oral Daily  . methadone  70 mg Oral Daily  . nicotine  14 mg Transdermal Daily  . piperacillin-tazobactam (ZOSYN)  IV  3.375 g Intravenous Q8H  . pneumococcal  23 valent vaccine  0.5 mL Intramuscular Tomorrow-1000  . potassium chloride  40 mEq Oral BID  . sodium chloride  3 mL Intravenous Q12H   Continuous Infusions:    Nerine Pulse, DO  [facility] Pager [phone]  If 7PM-7AM, please contact night-coverage [email or web address] Password [email or web address] 04/01/2014, 6:43 PM   LOS: 2 days

## 2014-04-01 NOTE — Progress Notes (Signed)
Patient stated that her pain is 7/10. PCP on call was notified.

## 2014-04-01 NOTE — Progress Notes (Signed)
Patient ID: Julia Payne, female   DOB: 1970-09-14, 44 y.o.   MRN: [id number]   SUBJECTIVE:  Denies SOB this am  OBJECTIVE:   Vitals:   Filed Vitals:   03/31/14 2134 04/01/14 0602 04/01/14 0734 04/01/14 0819  BP: 146/82 153/92 142/95   Pulse: 66 59 67   Temp: 98.3 F (36.8 C) 98 F (36.7 C)    TempSrc: Oral Oral    Resp: 16 18    Height:      Weight:  147 lb (66.679 kg)    SpO2: 100% 100%  97%   I&O's:    Intake/Output Summary (Last 24 hours) at 04/01/14 0843 Last data filed at 04/01/14 0600  Gross per 24 hour  Intake    950 ml  Output    600 ml  Net    350 ml   TELEMETRY: Reviewed telemetry pt in NSR: 04/01/2014   Current facility-administered medications:  .  carvedilol (COREG) tablet 12.5 mg, 12.5 mg, Oral, BID WC, Floydene FlockSteven J Newton, MD, 12.5 mg at 04/01/14 0734 .  DULoxetine (CYMBALTA) DR capsule 60 mg, 60 mg, Oral, Daily, Floydene FlockSteven J Newton, MD, 60 mg at 03/31/14 0911 .  furosemide (LASIX) injection 40 mg, 40 mg, Intravenous, BID, Padmaja Vasireddy, MD, 40 mg at 04/01/14 0734 .  heparin injection 5,000 Units, 5,000 Units, Subcutaneous, 3 times per day, Floydene FlockSteven J Newton, MD, 5,000 Units at 04/01/14 [id number] .  ipratropium-albuterol (DUONEB) 0.5-2.5 (3) MG/3ML nebulizer solution 3 mL, 3 mL, Nebulization, TID, Padmaja Vasireddy, MD, 3 mL at 04/01/14 0816 .  ipratropium-albuterol (DUONEB) 0.5-2.5 (3) MG/3ML nebulizer solution 3 mL, 3 mL, Nebulization, Q4H PRN, Padmaja Vasireddy, MD .  irbesartan (AVAPRO) tablet 150 mg, 150 mg, Oral, Daily, Floydene FlockSteven J Newton, MD, 150 mg at 03/31/14 0911 .  LORazepam (ATIVAN) injection 1 mg, 1 mg, Intravenous, Q8H PRN, Padmaja Vasireddy, MD, 1 mg at 03/31/14 1039 .  methadone (DOLOPHINE) 10 MG/ML solution 70 mg, 70 mg, Oral, Daily, Floydene FlockSteven J Newton, MD, 70 mg at 03/31/14 1000 .  nicotine (NICODERM CQ - dosed in mg/24 hours) patch 14 mg, 14 mg, Transdermal, Daily, Rolan Lipahomas Michael Callahan, NP, 14 mg at 03/31/14 0912 .  piperacillin-tazobactam (ZOSYN) IVPB  3.375 g, 3.375 g, Intravenous, Q8H, Randall K Absher, RPH, 3.375 g at 04/01/14 0206 .  pneumococcal 23 valent vaccine (PNU-IMMUNE) injection 0.5 mL, 0.5 mL, Intramuscular, Tomorrow-1000, David Tat, MD .  sodium chloride 0.9 % injection 3 mL, 3 mL, Intravenous, Q12H, Floydene FlockSteven J Newton, MD, 3 mL at 03/31/14 2116    PHYSICAL EXAM General: Well developed, well nourished, in no acute distress Head: Eyes PERRLA, No xanthomas.   Normal cephalic and atramatic  Lungs:   Clear bilaterally to auscultation and percussion. Heart:   HRRR S1 S2 Pulses are 2+ & equal. Abdomen: Bowel sounds are positive, abdomen soft and non-tender without masses  Extremities:   No clubbing, cyanosis or edema.  DP +1 Neuro: Alert and oriented X 3. Psych:  Good affect, responds appropriately   LABS: Basic Metabolic Panel:  Recent Labs  11/91/4703/19/16 0154  03/31/14 0346 04/01/14 0555  NA  --   < > 139 137  K  --   < > 3.9 3.1*  CL  --   < > 103 96  CO2  --   < > 27 29  GLUCOSE  --   < > 142* 94  BUN  --   < > 11 13  CREATININE 0.53  < > 0.49* 0.63  CALCIUM  --   < > 9.4 8.8  MG 1.9  --   --   --   < > = values in this interval not displayed. Liver Function Tests:  Recent Labs  03/31/14 0346 04/01/14 0555  AST 21 40*  ALT 18 38*  ALKPHOS 70 65  BILITOT 0.5 0.5  PROT 7.7 7.2  ALBUMIN 3.6 3.5   CBC:  Recent Labs  03/31/14 0346 04/01/14 0555  WBC 23.5* 28.4*  NEUTROABS 19.9* 19.9*  HGB 13.6 13.7  HCT 41.6 42.3  MCV 88.7 89.2  PLT 189 189   Cardiac Enzymes:  Recent Labs  03/30/14 0154 03/30/14 1030  TROPONINI <0.03 <0.03    RADIOLOGY: Dg Chest 2 View  03/29/2014   CLINICAL DATA:  Shortness of breath, dry cough.  Smoker.  EXAM: CHEST  2 VIEW  COMPARISON:  None.  FINDINGS: Heart size enlarged. Aortic arch with mild atherosclerosis. Bihilar fullness. Right greater than left lung base opacities. No pleural effusion or pneumothorax. No acute osseous finding.  IMPRESSION: Heart size enlarged.   Bihilar fullness may reflect vascular congestion. Is there outside prior imaging to document stability? If not, consider chest CT to exclude adenopathy.  Lung base opacities may reflect atelectasis, scarring, and/or pneumonia.   Electronically Signed   By: Jearld Lesch M.D.   On: 03/29/2014 19:07   Ct Angio Chest Pe W/cm &/or Wo Cm  03/29/2014   CLINICAL DATA:  Shortness of breath for 3 days wheezing for 2 days with chest tightness. History of cardiomyopathy.  EXAM: CT ANGIOGRAPHY CHEST WITH CONTRAST  TECHNIQUE: Multidetector CT imaging of the chest was performed using the standard protocol during bolus administration of intravenous contrast. Multiplanar CT image reconstructions and MIPs were obtained to evaluate the vascular anatomy.  CONTRAST:  OMNIPAQUE IOHEXOL 350 MG/ML SOLN  COMPARISON:  Chest radiograph March 29, 2014 at 1849 hours  FINDINGS: PULMONARY ARTERY: Adequate contrast opacification of the pulmonary artery's. Main pulmonary artery is not enlarged. No pulmonary arterial filling defects to the level of the subsegmental branches.  MEDIASTINUM: Heart is moderately enlarged. No RIGHT heart strain. No pericardial fluid collections contrast refluxing into the inferior vena cava. Thoracic aorta is normal course and caliber, unremarkable. Hilar lymphadenopathy bilaterally, borderline subcarinal lymphadenopathy.  LUNGS: Tracheobronchial tree is patent, no pneumothorax. Peribronchial wall thickening. Moderate apparent centrilobular emphysema. Patchy consolidation in the RIGHT greater than LEFT lung bases with small RIGHT pleural effusion. 5 mm sub solid pulmonary nodule LEFT lung apex. Mild paraseptal emphysema.  SOFT TISSUES AND OSSEOUS STRUCTURES: Included view of the abdomen is unremarkable. Visualized soft tissues and included osseous structures appear normal.  Review of the MIP images confirms the above findings.  IMPRESSION: No acute pulmonary embolism.  Moderate cardiomegaly. Bronchial wall  thickening may refer reflect bronchitis or pulmonary edema with small RIGHT pleural effusion. Patchy presumed atelectasis in the lung bases.  Symmetric hilar lymphadenopathy, which could be infectious or inflammatory, recommend follow-up.  Moderate centrilobular and to lesser extent paraseptal emphysema.  5 mm LEFT apical sub solid pulmonary nodule. Initial follow-up by chest CT without contrast is recommended in 3 months to confirm persistence. This recommendation follows the consensus statement: Recommendations for the Management of Subsolid Pulmonary Nodules Detected at CT: A Statement from the Fleischner Society as published in Radiology 2013; 266:304-317.   Electronically Signed   By: Awilda Metro   On: 03/29/2014 22:27   Dg Chest Port 1 View  03/30/2014   CLINICAL DATA:  Pt unable/unwilling  to hold still or sit back for imaging, pt is in "Psych ED," Best image obtainable at this time. Low O2 sats  EXAM: PORTABLE CHEST - 1 VIEW  COMPARISON:  03/29/2014  FINDINGS: Cardiac silhouette is mildly enlarged. No mediastinal or hilar masses. There is increased opacity in the perihilar and lower lung zones when compared to the previous day's study. This may reflect pulmonary edema, atelectasis or a combination. No pneumothorax.  IMPRESSION: Worsened lung aeration with increased perihilar and lower lung zone opacity. This may be due to atelectasis, pulmonary edema or infiltrate, or a combination.   Electronically Signed   By: Amie Portland M.D.   On: 03/30/2014 09:52   ASSESSMENT/PLAN:  CHF:  Echo reviewed and EF 30-35%  Continue iv lasix today and change to PO in am  Suppl K   Increase Avapro to 300 mg and continue coreg   Will sing off  And arrange outpatient f/u with Dr Mayford Knife Will likely be ready for d/c 48 hrs    Lungs improved and does not look volume overloaded  Afebrile but WBC elevated     Charlton Haws, MD  04/01/2014  8:43 AM

## 2014-04-02 DIAGNOSIS — I5023 Acute on chronic systolic (congestive) heart failure: Principal | ICD-10-CM

## 2014-04-02 DIAGNOSIS — J9600 Acute respiratory failure, unspecified whether with hypoxia or hypercapnia: Secondary | ICD-10-CM

## 2014-04-02 LAB — CBC WITH DIFFERENTIAL/PLATELET
Basophils Absolute: 0 10*3/uL (ref 0.0–0.1)
Basophils Relative: 0 % (ref 0–1)
Eosinophils Absolute: 0.2 10*3/uL (ref 0.0–0.7)
Eosinophils Relative: 1 % (ref 0–5)
HCT: 44.5 % (ref 36.0–46.0)
Hemoglobin: 15.1 g/dL — ABNORMAL HIGH (ref 12.0–15.0)
Lymphocytes Relative: 48 % — ABNORMAL HIGH (ref 12–46)
Lymphs Abs: 8.7 10*3/uL — ABNORMAL HIGH (ref 0.7–4.0)
MCH: 30.4 pg (ref 26.0–34.0)
MCHC: 33.9 g/dL (ref 30.0–36.0)
MCV: 89.7 fL (ref 78.0–100.0)
MONO ABS: 1.8 10*3/uL — AB (ref 0.1–1.0)
MONOS PCT: 10 % (ref 3–12)
NEUTROS ABS: 7.5 10*3/uL (ref 1.7–7.7)
NEUTROS PCT: 41 % — AB (ref 43–77)
Platelets: 184 10*3/uL (ref 150–400)
RBC: 4.96 MIL/uL (ref 3.87–5.11)
RDW: 14 % (ref 11.5–15.5)
WBC: 18.2 10*3/uL — ABNORMAL HIGH (ref 4.0–10.5)

## 2014-04-02 LAB — COMPREHENSIVE METABOLIC PANEL
ALT: 44 U/L — ABNORMAL HIGH (ref 0–35)
ANION GAP: 10 (ref 5–15)
AST: 31 U/L (ref 0–37)
Albumin: 3.6 g/dL (ref 3.5–5.2)
Alkaline Phosphatase: 76 U/L (ref 39–117)
BUN: 13 mg/dL (ref 6–23)
CO2: 30 mmol/L (ref 19–32)
Calcium: 8.8 mg/dL (ref 8.4–10.5)
Chloride: 98 mmol/L (ref 96–112)
Creatinine, Ser: 0.62 mg/dL (ref 0.50–1.10)
GLUCOSE: 87 mg/dL (ref 70–99)
Potassium: 3.9 mmol/L (ref 3.5–5.1)
Sodium: 138 mmol/L (ref 135–145)
Total Bilirubin: 0.5 mg/dL (ref 0.3–1.2)
Total Protein: 7.4 g/dL (ref 6.0–8.3)

## 2014-04-02 LAB — PROCALCITONIN: Procalcitonin: <0.1 ng/mL

## 2014-04-02 LAB — PATHOLOGIST SMEAR REVIEW

## 2014-04-02 MED ORDER — CARVEDILOL 12.5 MG PO TABS: 12.5000 mg | ORAL_TABLET | Freq: Two times a day (BID) | ORAL | Status: DC

## 2014-04-02 MED ORDER — LEVOFLOXACIN 750 MG PO TABS
750.0000 mg | ORAL_TABLET | Freq: Every day | ORAL | Status: DC
  Administered 2014-04-02: 750 mg via ORAL
  Filled 2014-04-02: qty 1

## 2014-04-02 MED ORDER — LEVOFLOXACIN 750 MG PO TABS: 750.0000 mg | ORAL_TABLET | Freq: Every day | ORAL | Status: DC

## 2014-04-02 MED ORDER — POTASSIUM CHLORIDE CRYS ER 20 MEQ PO TBCR: 40.0000 meq | EXTENDED_RELEASE_TABLET | Freq: Every day | ORAL | Status: DC

## 2014-04-02 MED ORDER — FUROSEMIDE 40 MG PO TABS: 40.0000 mg | ORAL_TABLET | Freq: Every day | ORAL | Status: DC

## 2014-04-02 MED ORDER — FUROSEMIDE 40 MG PO TABS: 40.0000 mg | ORAL_TABLET | Freq: Two times a day (BID) | ORAL | Status: DC

## 2014-04-02 MED ORDER — IRBESARTAN 300 MG PO TABS: 300.0000 mg | ORAL_TABLET | Freq: Every day | ORAL | Status: DC

## 2014-04-02 NOTE — Discharge Summary (Signed)
Physician Discharge Summary  Julia Payne ZOX:[id number] DOB: 1970/03/06 DOA: 03/29/2014  PCP: No primary care provider on file.  Admit date: 03/29/2014 Discharge date: 04/02/2014  Recommendations for Outpatient Follow-up:  1. Pt will need to follow up with PCP in 2 weeks post discharge 2. 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 -Await echocardiogram--EF 30-35%, diffuse HK -Continue intravenous furosemide through today--po lasix on 3/22 -Clinically improving with diuresis  -neg 3.3 L for the admission -04/01/2014 echo EF 30-35%, diffuse HK -daily weights -Continue carvedilol 12.5mg  bid -Continue ARB--dose increased to 300mg  -BNP 555 on the day of admission Leukocytosis  -due to steroids--d/ced after dose on 03/31/14 -WBC 10.8 on the day of admission  -Chek procalcitonin--neg  -Discontinue Solu-Medrol  -d/c vancomycin -d/c zosyn -Levofloxacin x 3 more days  Atypical chest pain  -Troponins negative 3  - EKG showed T-wave inversion II, III, aVF  -CT angiogram chest negative for pulmonary embolus Depression  -Continue Cymbalta  History of heroin abuse  -Continue methadone--follow up at [facility] left apex lung nodule  -Will need outpatient surveillance   Discharge Condition: stable  Disposition: home Follow-up Information    Follow up with [facility]    . Call today.   Why:  see the follow up list for family doctors - call today - you can be seen within 3 days   Contact information:   [address] [place] [address] [phone]      Follow up with Quintella Reichert, MD In 1 week.   Specialty:  Cardiology   Contact information:   [address] [place] [address] [phone]       Diet: heart healthy Wt Readings from Last 3 Encounters:  04/02/14 66.361 kg (146 lb 4.8 oz)    History of present illness:  44 year old female with a history of postpartum cardiomyopathy diagnosed 10 years ago, tobacco use, history of heroin use--on methadone, hypertension presents with 2-3 day history of worsening shortness of breath. The patient denied any fevers, chills, nausea, vomiting, diarrhea, abdominal pain, dysuria, hemoptysis. The patient continues to smoke 1 pack per day (20 pack years), but denies any illegal drug use. She [facility]. The patient was admitted for acuterespiratory failure secondary to pulmonary edema. There was some concern for an infectious component. The patient was also started on intravenous steroids as she was having significant bronchospasm. The patient was started on vancomycin and Zosyn after blood cultures were obtained. Cultures have remained negative and procalcitonin was neg; therefore, abx have been de-escalated. When the patient's clinical scenario became more evident, the patient's steroids were discontinued. The patient was continued on intravenous furosemide with good clinical effect. She will continue on carvedilol for her cardiomyopathy. Echocardiogram showed EF 30-35%. The patient will be sent home with oral furosemide. She was instructed to follow-up with cardiology in 1 week. She'll have 3 additional days of antibiotics to complete one week of therapy.    Consultants: cardiology  Discharge Exam: Filed Vitals:   04/02/14 0913  BP: 147/106  Pulse: 74  Temp:   Resp:  Filed Vitals:   04/02/14 0830 04/02/14 0859 04/02/14 0905 04/02/14 0913  BP:  152/100 155/104 147/106    Pulse:  67 74 74  Temp:      TempSrc:      Resp:      Height:      Weight:      SpO2: 96%      General: A&O x 3, NAD, pleasant, cooperative Cardiovascular: RRR, no rub, no gallop, no S3 Respiratory: CTAB, no wheeze, no rhonchi Abdomen:soft, nontender, nondistended, positive bowel sounds Extremities: No edema, No lymphangitis, no petechiae  Discharge Instructions      Discharge Instructions    Diet - low sodium heart healthy    Complete by:  As directed      Increase activity slowly    Complete by:  As directed             Medication List    STOP taking these medications        valsartan 160 MG tablet  Commonly known as:  DIOVAN  Replaced by:  irbesartan 300 MG tablet      TAKE these medications        carvedilol 12.5 MG tablet  Commonly known as:  COREG  Take 1 tablet (12.5 mg total) by mouth 2 (two) times daily with a meal.     diphenhydrAMINE 25 mg capsule  Commonly known as:  BENADRYL  Take 25 mg by mouth every 6 (six) hours as needed for allergies or sleep.     DULoxetine 60 MG capsule  Commonly known as:  CYMBALTA  Take 60 mg by mouth daily.     furosemide 40 MG tablet  Commonly known as:  LASIX  Take 1 tablet (40 mg total) by mouth 2 (two) times daily.     irbesartan 300 MG tablet  Commonly known as:  AVAPRO  Take 1 tablet (300 mg total) by mouth daily.     levofloxacin 750 MG tablet  Commonly known as:  LEVAQUIN  Take 1 tablet (750 mg total) by mouth daily.     methadone 10 MG/ML solution  Commonly known as:  DOLOPHINE  Take 70 mg by mouth daily.     potassium chloride SA 20 MEQ tablet  Commonly known as:  K-DUR,KLOR-CON  Take 2 tablets (40 mEq total) by mouth daily.         The results of significant diagnostics from this hospitalization (including imaging, microbiology, ancillary and laboratory) are listed below for reference.    Significant Diagnostic Studies: Dg Chest 2 View  03/29/2014   CLINICAL DATA:  Shortness of breath,  dry cough.  Smoker.  EXAM: CHEST  2 VIEW  COMPARISON:  None.  FINDINGS: Heart size enlarged. Aortic arch with mild atherosclerosis. Bihilar fullness. Right greater than left lung base opacities. No pleural effusion or pneumothorax. No acute osseous finding.  IMPRESSION: Heart size enlarged.  Bihilar fullness may reflect vascular congestion. Is there outside prior imaging to document stability? If not, consider chest CT to exclude adenopathy.  Lung base opacities may reflect atelectasis, scarring, and/or pneumonia.   Electronically Signed   By: Jearld Lesch M.D.   On: 03/29/2014 19:07   Ct Angio Chest Pe W/cm &/or Wo Cm  03/29/2014   CLINICAL DATA:  Shortness of breath for 3 days wheezing for 2 days with chest tightness. History of cardiomyopathy.  EXAM: CT ANGIOGRAPHY CHEST WITH CONTRAST  TECHNIQUE: Multidetector CT imaging of the chest was performed using the standard protocol during  bolus administration of intravenous contrast. Multiplanar CT image reconstructions and MIPs were obtained to evaluate the vascular anatomy.  CONTRAST:  100mL OMNIPAQUE IOHEXOL 350 MG/ML SOLN  COMPARISON:  Chest radiograph March 29, 2014 at 1849 hours  FINDINGS: PULMONARY ARTERY: Adequate contrast opacification of the pulmonary artery's. Main pulmonary artery is not enlarged. No pulmonary arterial filling defects to the level of the subsegmental branches.  MEDIASTINUM: Heart is moderately enlarged. No RIGHT heart strain. No pericardial fluid collections contrast refluxing into the inferior vena cava. Thoracic aorta is normal course and caliber, unremarkable. Hilar lymphadenopathy bilaterally, borderline subcarinal lymphadenopathy.  LUNGS: Tracheobronchial tree is patent, no pneumothorax. Peribronchial wall thickening. Moderate apparent centrilobular emphysema. Patchy consolidation in the RIGHT greater than LEFT lung bases with small RIGHT pleural effusion. 5 mm sub solid pulmonary nodule LEFT lung apex. Mild paraseptal emphysema.   SOFT TISSUES AND OSSEOUS STRUCTURES: Included view of the abdomen is unremarkable. Visualized soft tissues and included osseous structures appear normal.  Review of the MIP images confirms the above findings.  IMPRESSION: No acute pulmonary embolism.  Moderate cardiomegaly. Bronchial wall thickening may refer reflect bronchitis or pulmonary edema with small RIGHT pleural effusion. Patchy presumed atelectasis in the lung bases.  Symmetric hilar lymphadenopathy, which could be infectious or inflammatory, recommend follow-up.  Moderate centrilobular and to lesser extent paraseptal emphysema.  5 mm LEFT apical sub solid pulmonary nodule. Initial follow-up by chest CT without contrast is recommended in 3 months to confirm persistence. This recommendation follows the consensus statement: Recommendations for the Management of Subsolid Pulmonary Nodules Detected at CT: A Statement from the Fleischner Society as published in Radiology 2013; 266:304-317.   Electronically Signed   By: Awilda Metroourtnay  Bloomer   On: 03/29/2014 22:27   Dg Chest Port 1 View  03/30/2014   CLINICAL DATA:  Pt unable/unwilling to hold still or sit back for imaging, pt is in "Psych ED," Best image obtainable at this time. Low O2 sats  EXAM: PORTABLE CHEST - 1 VIEW  COMPARISON:  03/29/2014  FINDINGS: Cardiac silhouette is mildly enlarged. No mediastinal or hilar masses. There is increased opacity in the perihilar and lower lung zones when compared to the previous day's study. This may reflect pulmonary edema, atelectasis or a combination. No pneumothorax.  IMPRESSION: Worsened lung aeration with increased perihilar and lower lung zone opacity. This may be due to atelectasis, pulmonary edema or infiltrate, or a combination.   Electronically Signed   By: Amie Portlandavid  Ormond M.D.   On: 03/30/2014 09:52     Microbiology: Recent Results (from the past 240 hour(s))  Culture, blood (routine x 2)     Status: None (Preliminary result)   Collection Time: 03/30/14   1:02 AM  Result Value Ref Range Status   Specimen Description BLOOD BLOOD LEFT FOREARM  Final   Special Requests BOTTLES DRAWN AEROBIC AND ANAEROBIC 5CC  Final   Culture   Final           BLOOD CULTURE RECEIVED NO GROWTH TO DATE CULTURE WILL BE HELD FOR 5 DAYS BEFORE ISSUING A FINAL NEGATIVE REPORT Performed at [facility]    Report Status PENDING  Incomplete  Culture, blood (routine x 2)     Status: None (Preliminary result)   Collection Time: 03/30/14  1:52 AM  Result Value Ref Range Status   Specimen Description BLOOD RIGHT HAND  Final   Special Requests BOTTLES DRAWN AEROBIC AND ANAEROBIC 5CC  Final   Culture   Final  BLOOD CULTURE RECEIVED NO GROWTH TO DATE CULTURE WILL BE HELD FOR 5 DAYS BEFORE ISSUING A FINAL NEGATIVE REPORT Performed at [facility]    Report Status PENDING  Incomplete  MRSA PCR Screening     Status: None   Collection Time: 03/30/14  2:04 PM  Result Value Ref Range Status   MRSA by PCR NEGATIVE NEGATIVE Final    Comment:        The GeneXpert MRSA Assay (FDA approved for NASAL specimens only), is one component of a comprehensive MRSA colonization surveillance program. It is not intended to diagnose MRSA infection nor to guide or monitor treatment for MRSA infections.      Labs: Basic Metabolic Panel:  Recent Labs Lab 03/29/14 1812 03/30/14 0154 03/30/14 0515 03/31/14 0346 04/01/14 0555 04/02/14 0425  NA 139  --  140 139 137 138  K 3.1*  --  3.6 3.9 3.1* 3.9  CL 104  --  105 103 96 98  CO2 26  --  GLUCOSE 134*  --  103* 142* 94 87  BUN 8  --  CREATININE 0.60 0.53 0.46* 0.49* 0.63 0.62  CALCIUM 8.8  --  8.8 9.4 8.8 8.8  MG  --  1.9  --   --   --   --    Liver Function Tests:  Recent Labs Lab 03/30/14 0515 03/31/14 0346 04/01/14 0555 04/02/14 0425  AST 17 21 40* 31  ALT 14 18 38* 44*  ALKPHOS 66 70 65 76  BILITOT 0.5 0.5 0.5 0.5  PROT 6.8 7.7 7.2 7.4  ALBUMIN 3.4* 3.6 3.5 3.6     No results for input(s): LIPASE, AMYLASE in the last 168 hours. No results for input(s): AMMONIA in the last 168 hours. CBC:  Recent Labs Lab 03/30/14 0154 03/30/14 0515 03/31/14 0346 04/01/14 0555 04/02/14 0425  WBC 10.0 9.9 23.5* 28.4* 18.2*  NEUTROABS  --  4.3 19.9* 19.9* 7.5  HGB 12.4 12.4 13.6 13.7 15.1*  HCT 37.7 37.4 41.6 42.3 44.5  MCV 87.9 88.0 88.7 89.2 89.7  PLT 172 152 189 189 184   Cardiac Enzymes:  Recent Labs Lab 03/30/14 0154 03/30/14 1030  TROPONINI <0.03 <0.03   BNP: Invalid input(s): POCBNP CBG: No results for input(s): GLUCAP in the last 168 hours.  Time coordinating discharge:  Greater than 30 minutes  Signed:  Cordarro Spinnato, DO [facility] Pager: [phone] 04/02/2014, 12:50 PM

## 2014-04-02 NOTE — Progress Notes (Signed)
Patient discharged home/discharge instructions given and explained to patient and she verbalized understanding, denies any pain/distress. Accompanied home by daughter. No wound noted, skin intact.

## 2014-04-05 LAB — CULTURE, BLOOD (ROUTINE X 2)
CULTURE: NO GROWTH
Culture: NO GROWTH

## 2014-04-09 ENCOUNTER — Encounter: Payer: Self-pay | Admitting: Cardiology

## 2014-04-09 DIAGNOSIS — I5022 Chronic systolic (congestive) heart failure: Secondary | ICD-10-CM

## 2014-04-09 HISTORY — DX: Chronic systolic (congestive) heart failure: I50.22

## 2014-04-09 NOTE — Progress Notes (Signed)
This encounter was created in error - please disregard.  This encounter was created in error - please disregard.

## 2014-04-10 ENCOUNTER — Encounter: Payer: Self-pay | Admitting: Cardiology

## 2014-04-18 ENCOUNTER — Ambulatory Visit: Payer: Medicare Other | Admitting: Internal Medicine

## 2014-04-23 ENCOUNTER — Ambulatory Visit (INDEPENDENT_AMBULATORY_CARE_PROVIDER_SITE_OTHER): Payer: Medicare Other | Admitting: Cardiology

## 2014-04-23 ENCOUNTER — Encounter: Payer: Self-pay | Admitting: Cardiology

## 2014-04-23 VITALS — BP 140/92 | HR 86 | Ht 62.0 in | Wt 154.4 lb

## 2014-04-23 DIAGNOSIS — Z3490 Encounter for supervision of normal pregnancy, unspecified, unspecified trimester: Secondary | ICD-10-CM

## 2014-04-23 DIAGNOSIS — I10 Essential (primary) hypertension: Secondary | ICD-10-CM

## 2014-04-23 DIAGNOSIS — Z331 Pregnant state, incidental: Secondary | ICD-10-CM

## 2014-04-23 DIAGNOSIS — R079 Chest pain, unspecified: Secondary | ICD-10-CM | POA: Diagnosis not present

## 2014-04-23 DIAGNOSIS — Z01812 Encounter for preprocedural laboratory examination: Secondary | ICD-10-CM

## 2014-04-23 DIAGNOSIS — I5022 Chronic systolic (congestive) heart failure: Secondary | ICD-10-CM

## 2014-04-23 DIAGNOSIS — Z9189 Other specified personal risk factors, not elsewhere classified: Secondary | ICD-10-CM

## 2014-04-23 DIAGNOSIS — O903 Peripartum cardiomyopathy: Secondary | ICD-10-CM | POA: Diagnosis not present

## 2014-04-23 DIAGNOSIS — R5383 Other fatigue: Secondary | ICD-10-CM

## 2014-04-23 DIAGNOSIS — Z3200 Encounter for pregnancy test, result unknown: Secondary | ICD-10-CM

## 2014-04-23 DIAGNOSIS — IMO0002 Reserved for concepts with insufficient information to code with codable children: Secondary | ICD-10-CM

## 2014-04-23 DIAGNOSIS — R0602 Shortness of breath: Secondary | ICD-10-CM

## 2014-04-23 NOTE — Progress Notes (Signed)
Cardiology Office Note   Date:  04/23/2014   ID:  Manaia Samad, DOB 1970/09/20, MRN [id number]  PCP:  No primary care provider on file.    Chief Complaint  Patient presents with  . Congestive Heart Failure  . Cardiomyopathy      History of Present Illness: Julia Payne is a 44 y.o. female who presents for hospital followup after admission for acute on chronic systolic CHF.  She has a history of post partum DCM in the past and presented with acute respiratory distress secondary to acute bronchitis and acute diastolic CHF.  She was diuresed with IV Lasix.  Chest CT showed emphysema.  2D echo showed normal LVF with EF 30-35%.  She is on an ARB and BB.  Cardiac enzymes were negative.  She is doing much better today.  She has some atypical CP that she describes as feeling like pins and needles.  She still has SOB but improved.  She denies any LE edema or syncope.   She occasionally feels her heart racing.      Past Medical History  Diagnosis Date  . Cardiomyopathy 2011    EF 30-35%  . Drug abuse   . Hypertension   . Chronic systolic CHF (congestive heart failure) 04/09/2014    Past Surgical History  Procedure Laterality Date  . Leg surgery       Current Outpatient Prescriptions  Medication Sig Dispense Refill  . carvedilol (COREG) 25 MG tablet Take 25 mg by mouth 2 (two) times daily with a meal.    . diphenhydrAMINE (BENADRYL) 25 mg capsule Take 25 mg by mouth every 6 (six) hours as needed for allergies or sleep.    . furosemide (LASIX) 40 MG tablet Take 1 tablet (40 mg total) by mouth 2 (two) times daily. 60 tablet 0  . irbesartan (AVAPRO) 300 MG tablet Take 1 tablet (300 mg total) by mouth daily. 30 tablet 0  . methadone (DOLOPHINE) 10 MG/ML solution Take 70 mg by mouth daily.    . DULoxetine (CYMBALTA) 60 MG capsule Take 60 mg by mouth daily.    . potassium chloride SA (K-DUR,KLOR-CON) 20 MEQ tablet Take 2 tablets (40 mEq total) by mouth daily. (Patient not taking:  Reported on 04/23/2014) 30 tablet 0   No current facility-administered medications for this visit.    Allergies:   Review of patient's allergies indicates no known allergies.    Social History:  The patient  reports that she has been smoking.  She does not have any smokeless tobacco history on file. She reports that she does not drink alcohol or use illicit drugs.   Family History:  The patient's family history includes Heart attack in her father.    ROS:  Please see the history of present illness.   Otherwise, review of systems are positive for none.   All other systems are reviewed and negative.    PHYSICAL EXAM: VS:  BP 140/92 mmHg  Pulse 86  Ht  (1.575 m)  Wt 154 lb 6.4 oz (70.035 kg)  BMI 28.23 kg/m2  SpO2 98%  LMP 10/11/2013 (Approximate) , BMI Body mass index is 28.23 kg/(m^2). GEN: Well nourished, well developed, in no acute distress HEENT: normal Neck: no JVD, carotid bruits, or masses Cardiac: RRR; no murmurs, rubs, or gallops,no edema  Respiratory:  clear to auscultation bilaterally, normal work of breathing GI: soft, nontender, nondistended, + BS MS: no deformity or atrophy Skin: warm and dry, no rash Neuro:  Strength  and sensation are intact Psych: euthymic mood, full affect   EKG:  EKG is not ordered today.    Recent Labs: 03/29/2014: B Natriuretic Peptide 555.1* 03/30/2014: Magnesium 1.9 04/02/2014: ALT 44*; BUN 13; Creatinine 0.62; Hemoglobin 15.1*; Platelets 184; Potassium 3.9; Sodium 138    Lipid Panel No results found for: CHOL, TRIG, HDL, CHOLHDL, VLDL, LDLCALC, LDLDIRECT    Wt Readings from Last 3 Encounters:  04/23/14 154 lb 6.4 oz (70.035 kg)  04/02/14 146 lb 4.8 oz (66.361 kg)       ASSESSMENT AND PLAN:  1.  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  She was having LE as early as December.  She thinks the pregnancy triggered with worsening CHF.    2.  Chronic combined systolic/diastolic CHF - she appear euvolemic on exam.  I discussed at length with her the risks of getting pregnant again.  I have recommended that she see her PCP about birth control.  Contninue BB/ARB/Diuretic.   3.  History of heroin abuse 4.  Atypical chest pain with normal troponins but EKG is abnormal with inferolateral ST changes.  I have recommended that we proceed with Lexiscan myoview to rule out ischemia.     Current medicines are reviewed at length with the patient today.  The patient does not have concerns regarding medicines.  The following changes have been made:  no change  Labs/ tests ordered today include: see above assessment and plan  Orders Placed This Encounter  Procedures  . hCG, serum, qualitative  . Myocardial Perfusion Imaging     Disposition:   FU with me in 3 months   Signed, Quintella ReichertURNER,Akaash Vandewater R, MD  04/23/2014 11:06 AM    [facility] [address], [place], [place]  [address] Phone: [phone]; Fax: [phone]

## 2014-04-23 NOTE — Patient Instructions (Signed)
Medication Instructions:  Your physician recommends that you continue on your current medications as directed. Please refer to the Current Medication list given to you today.   Labwork: Your physician recommends that you have lab work TODAY.  Testing/Procedures: Your physician has requested that you have a lexiscan myoview. For further information please visit [email or web address]. Please follow instruction sheet, as given.   Follow-Up: Your physician recommends that you schedule a follow-up appointment in: 3 months with Dr. Mayford Knifeurner.  Any Other Special Instructions Will Be Listed Below (If Applicable).

## 2014-04-25 ENCOUNTER — Telehealth: Payer: Self-pay

## 2014-04-25 NOTE — Telephone Encounter (Signed)
Called patient to set up lab work since she left the office without getting it drawn. Needs to be done prior to lexiscan 4/22 or patient cannot have nuclear.  Left message to call back.

## 2014-04-29 ENCOUNTER — Telehealth: Payer: Self-pay | Admitting: *Deleted

## 2014-04-29 ENCOUNTER — Other Ambulatory Visit: Payer: Self-pay | Admitting: *Deleted

## 2014-04-29 MED ORDER — CARVEDILOL 25 MG PO TABS
25.0000 mg | ORAL_TABLET | Freq: Two times a day (BID) | ORAL | Status: AC
Start: 2014-04-29 — End: ?

## 2014-04-29 MED ORDER — FUROSEMIDE 40 MG PO TABS: 40.0000 mg | ORAL_TABLET | Freq: Two times a day (BID) | ORAL | Status: AC

## 2014-04-29 MED ORDER — IRBESARTAN 300 MG PO TABS: 300.0000 mg | ORAL_TABLET | Freq: Every day | ORAL | Status: AC

## 2014-04-29 NOTE — Telephone Encounter (Signed)
Patient stated that she was told at her office visit that her potassium was going to be changed to something different.  She does not want to take the tablets as they are big like an alka seltzer and taste like salt. Please advise. Thanks, MI

## 2014-04-29 NOTE — Telephone Encounter (Signed)
Please talk with Kennon RoundsSally about the different formulations to get the smallest tolerable tablet

## 2014-04-29 NOTE — Telephone Encounter (Signed)
Left message to call back  

## 2014-04-30 ENCOUNTER — Other Ambulatory Visit: Payer: Self-pay

## 2014-04-30 ENCOUNTER — Other Ambulatory Visit: Payer: Medicare Other | Admitting: *Deleted

## 2014-04-30 DIAGNOSIS — Z01812 Encounter for preprocedural laboratory examination: Secondary | ICD-10-CM

## 2014-04-30 MED ORDER — POTASSIUM CHLORIDE ER 20 MEQ PO TBCR: 40.0000 meq | EXTENDED_RELEASE_TABLET | Freq: Every day | ORAL | Status: AC

## 2014-04-30 NOTE — Telephone Encounter (Signed)
Left message for patient that new Rx has been called in.  Instructed patient to call if she has any further questions.

## 2014-04-30 NOTE — Telephone Encounter (Signed)
Patient called back to follow up on what she can take in place of the potassium. She stated that she has not been taking the lasix since she needs to take it with the potassium.

## 2014-04-30 NOTE — Telephone Encounter (Signed)
Confirmed with patient she is coming in for lab work today.

## 2014-05-01 ENCOUNTER — Ambulatory Visit: Payer: Medicare Other

## 2014-05-01 LAB — HCG, SERUM, QUALITATIVE: PREG SERUM: NEGATIVE

## 2014-05-03 ENCOUNTER — Ambulatory Visit (HOSPITAL_COMMUNITY): Payer: Medicare Other | Attending: Cardiology | Admitting: Radiology

## 2014-05-03 DIAGNOSIS — R0602 Shortness of breath: Secondary | ICD-10-CM

## 2014-05-03 DIAGNOSIS — O903 Peripartum cardiomyopathy: Secondary | ICD-10-CM

## 2014-05-03 DIAGNOSIS — R079 Chest pain, unspecified: Secondary | ICD-10-CM

## 2014-05-03 MED ORDER — TECHNETIUM TC 99M SESTAMIBI GENERIC - CARDIOLITE
10.0000 | Freq: Once | INTRAVENOUS | Status: AC | PRN
  Administered 2014-05-03: 10 via INTRAVENOUS

## 2014-05-03 NOTE — Progress Notes (Unsigned)
[facility] SITE 3 NUCLEAR MED [address] [place], [place] [address] [phone]    Cardiology Nuclear Med Study  Julia Payne is a 44 y.o. female     MRN : [id number]     DOB: [birth year]  Procedure Date: 05/03/2014  Nuclear Med Background Indication for Stress Test:  Evaluation for Ischemia History:  Emphysema Cardiac Risk Factors: CHF  Symptoms:  Chest Pain, Rapid HR and SOB   Nuclear Pre-Procedure Caffeine/Decaff Intake:  7:30am NPO After: 11:00pm   Lungs:  {Exam; lungs brief:12271} O2 Sat: ***% on {Exam; oxygen delivery:30093}. IV 0.9% NS with Angio Cath:  {CHL IV 0.9% WITH ANGIO CATH NUC:21021042}  IV Site: {CHL IV SITE STRESS TEST:21021077}  IV Started by:  {CHL LB STRESS TEST IV STARTED [place]:70623762}BY:21021016}  Chest Size (in):  38 Cup Size: D  Height: 5\' 2"  (1.575 m)  Weight:  162 lb (73.483 kg)  BMI:  Body mass index is 29.62 kg/(m^2). Tech Comments: consumed caffeine 3 hours prior to arrival for test , pt to be 2 day study    Nuclear Med Study 1 or 2 day study: 2 day  Stress Test Type:  {CHL STRESS TEST GBTD:17616073}TYPE:21021018}  Reading MD: n/a  Order Authorizing Provider:  T.Turner,MD  Resting Radionuclide: Technetium 265m Sestamibi  Resting Radionuclide Dose: *** mCi   Stress Radionuclide:  Technetium 3465m Sestamibi  Stress Radionuclide Dose: *** mCi           Stress Protocol Rest HR: *** Stress HR: ***  Rest BP: *** Stress BP: ***  Exercise Time (min): {NA AND WILDCARD:21589} METS: {NA AND WILDCARD:21589}           Dose of Adenosine (mg):  {NA AND XTGGYIRS:85462}WILDCARD:21589} Dose of Lexiscan: {CHL CARD WILDCARD AND 0.4:21590} mg  Dose of Atropine (mg): {NA AND WILDCARD:21589} Dose of Dobutamine: {NA AND WILDCARD:21589} mcg/kg/min (at max HR)  Stress Test Technologist: {CHL LB STRESS TEST TECHNOLOGIST:21021024}  Nuclear Technologist:  {CHL LB NUCLEAR TECHNOLOGIST:21021025}     Rest Procedure:  Myocardial perfusion imaging was performed at rest 45 minutes following the  intravenous administration of Technetium 7265m Sestamibi. Rest ECG: {CHL REST VOJ:50093}ECG:21559}  Stress Procedure:  {CHL STRESS PROCEDURE NUCLEAR:21021028} Stress ECG: {CHL CAR STRESS ECG:21561}  QPS Raw Data Images:  {CHL RAW DATA IMAGES NUC:21021029} Stress Images:  {CHL STRESS IMAGES NUC:21021030} Rest Images:  {CHL REST IMAGES NUC:21021031} Subtraction (SDS):  {CHL SUBTRACTION (SDS) NUC:21021032} Transient Ischemic Dilatation (Normal <1.22):  *** Lung/Heart Ratio (Normal <0.45):  ***  Quantitative Gated Spect Images QGS EDV:  *** ml QGS ESV:  *** ml  Impression Exercise Capacity:  {CHL EXERCISE CAPACITY NUC:21021037} BP Response:  {CHL BP RESPONSE NUC:21021038} Clinical Symptoms:  {CHL CLINICAL SYMPTOMS NUC:21021039} ECG Impression:  {CHL ECG IMPRESSION NUC:21021040} Comparison with Prior Nuclear Study: {CHL NUCLEAR STUDY COMPARISON:21562}  Overall Impression:  {CHL OVERALL IMPRESSION NUC:21021041}  LV Ejection Fraction: {CHL CARD STUDY NOT GATED:21592:o}.  LV Wall Motion:  {CHL CARD QGS:21591:o}

## 2014-05-08 ENCOUNTER — Telehealth (HOSPITAL_COMMUNITY): Payer: Self-pay

## 2014-05-08 NOTE — Telephone Encounter (Signed)
Patient given detailed instructions per Myocardial Perfusion Study Information Sheet for test on 05-09-2014 at 8:00am Patient verbalized understanding. Julia HongPatsy Bryanna Yim, RN.

## 2014-05-09 ENCOUNTER — Encounter (HOSPITAL_COMMUNITY): Payer: Medicare Other

## 2014-05-15 ENCOUNTER — Ambulatory Visit: Payer: Medicare Other | Admitting: Family Medicine

## 2015-06-12 DEATH — deceased

## 2016-10-11 IMAGING — CT CT ANGIO CHEST
1 of 2 series · 18 of 32 positions shown · IV contrast (OMNIPAQUE 350)
Comparison: Chest radiograph March 29, 2014 at 1635 hours

CLINICAL DATA: Shortness of breath for 3 days wheezing for 2 days
with chest tightness. History of cardiomyopathy.

EXAM:
CT ANGIOGRAPHY CHEST WITH CONTRAST
TECHNIQUE: Multidetector CT imaging of the chest was performed using the
standard protocol during bolus administration of intravenous
contrast. Multiplanar CT image reconstructions and MIPs were
obtained to evaluate the vascular anatomy.
CONTRAST:  100mL OMNIPAQUE IOHEXOL 350 MG/ML SOLN

[Series 10: thins for pacs · axial · 0.74mm/px · z∈[-42,+169]mm · 18 of 235 slices shown]
[im 12/235  lung]
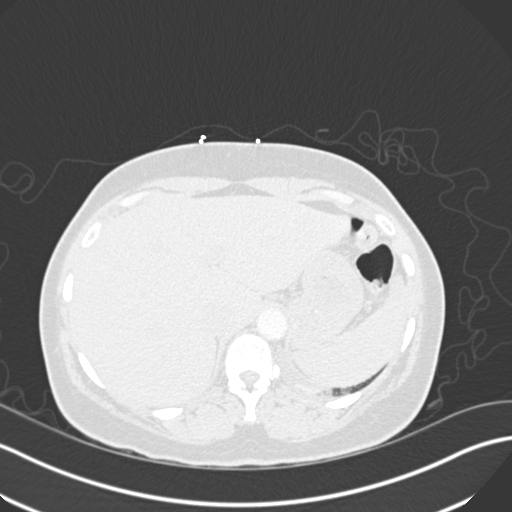
[im 24/235  mediastinal]
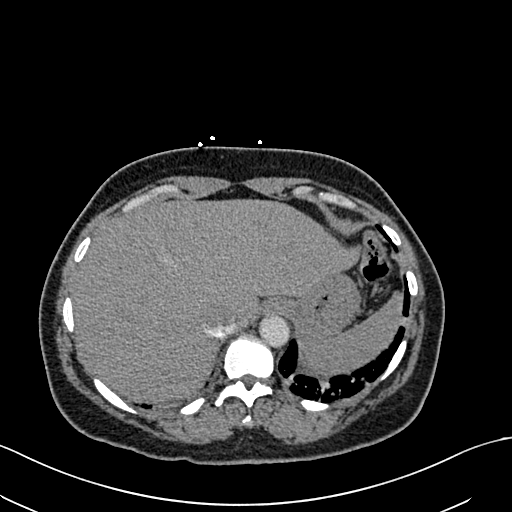
[im 47/235  lung]
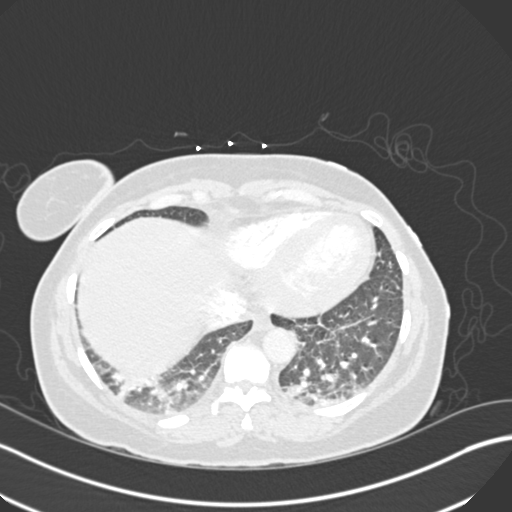
[im 59/235  mediastinal]
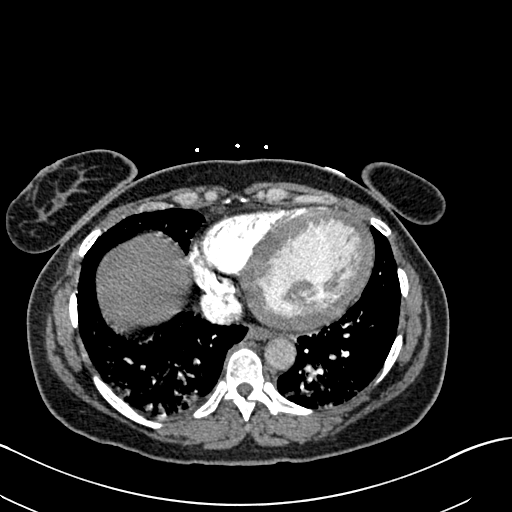
[im 71/235  lung]
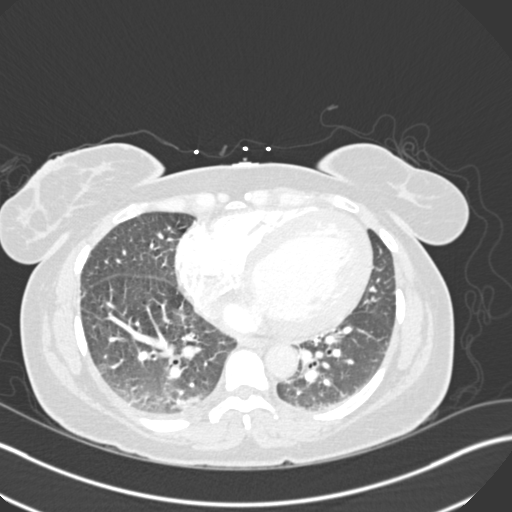
[im 79/235  mediastinal]
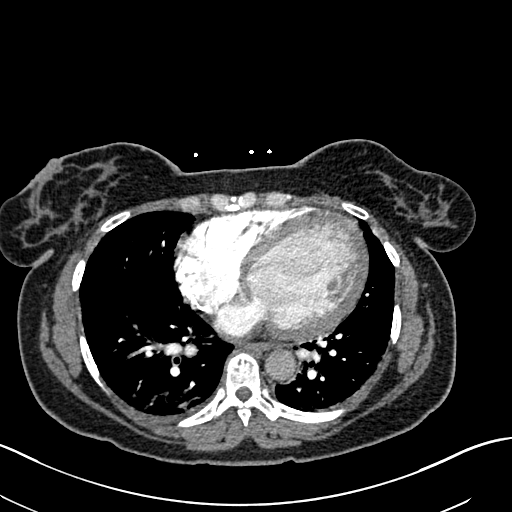
[im 82/235  lung]
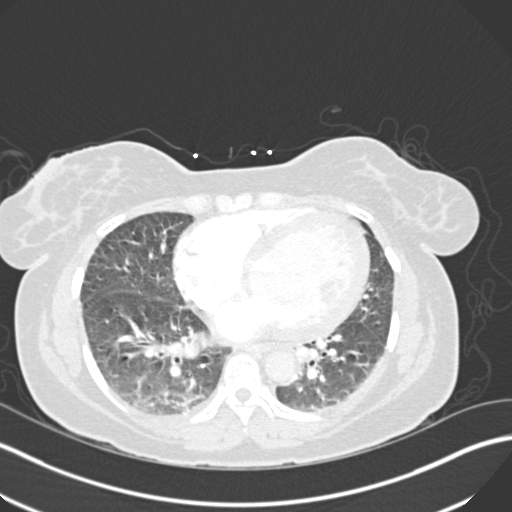
[im 106/235  mediastinal]
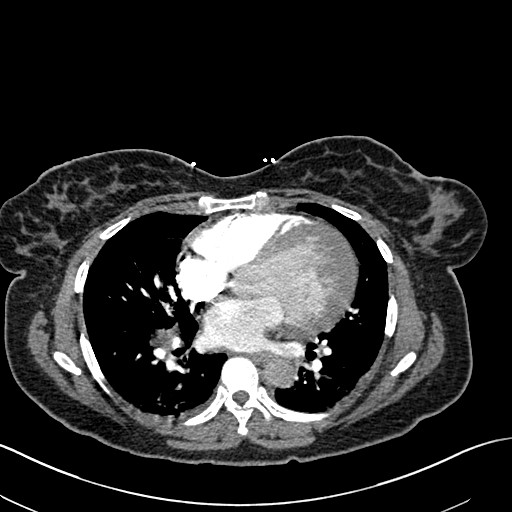
[im 108/235  lung]
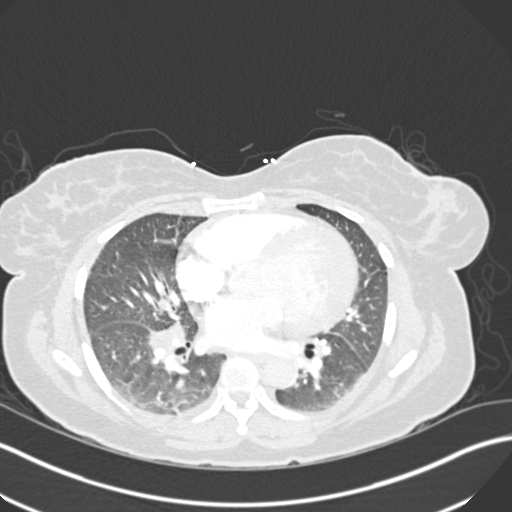
[im 118/235  mediastinal]
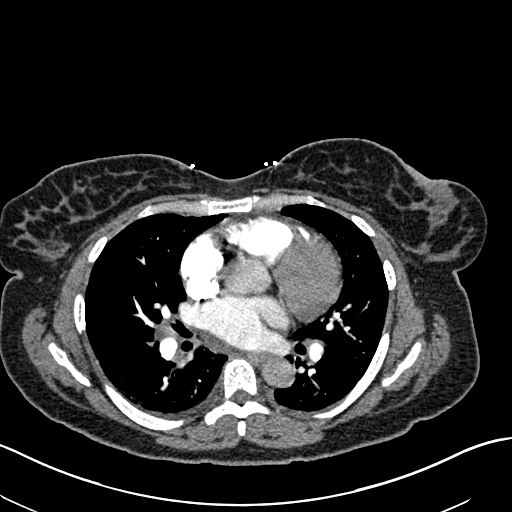
[im 129/235  lung]
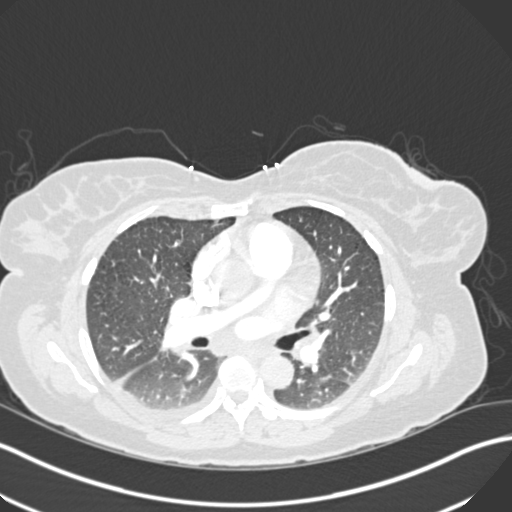
[im 153/235  mediastinal]
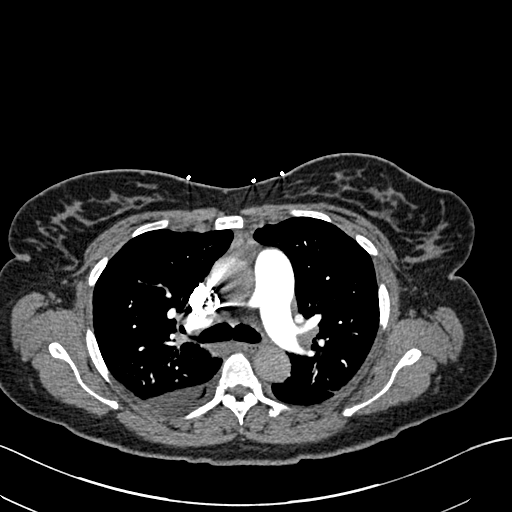
[im 157/235  lung]
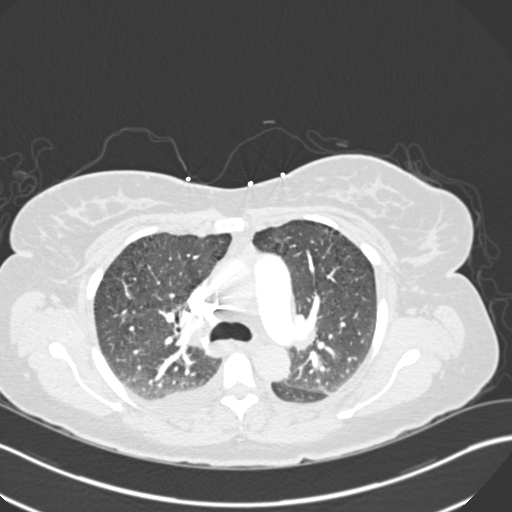
[im 164/235  mediastinal]
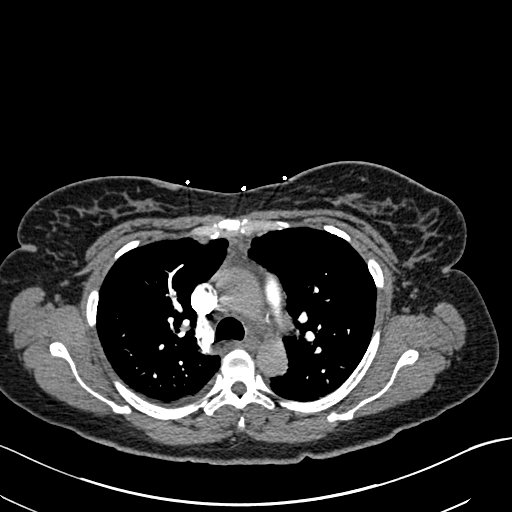
[im 176/235  lung]
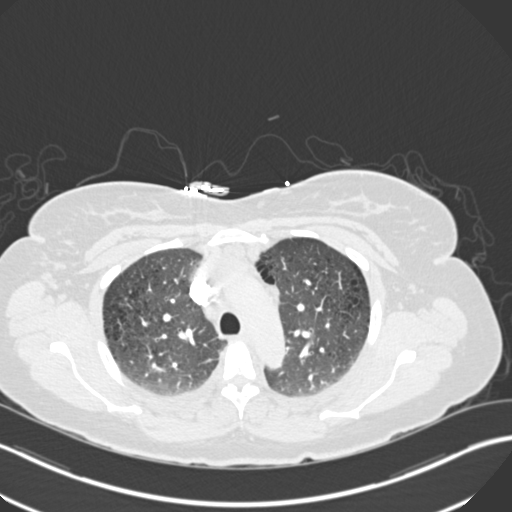
[im 188/235  mediastinal]
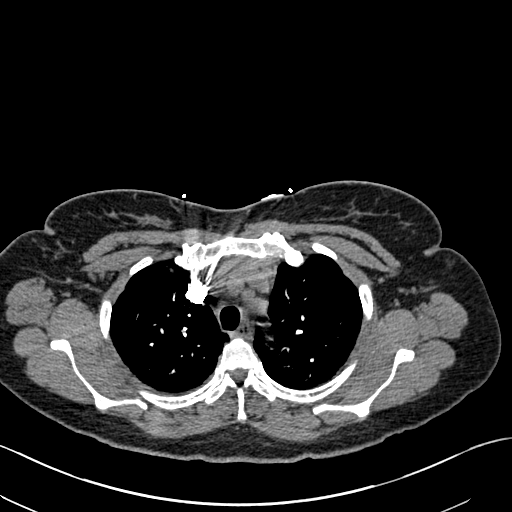
[im 211/235  lung]
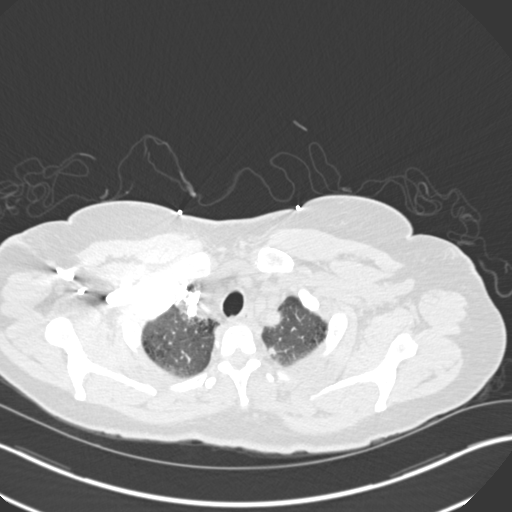
[im 223/235  mediastinal]
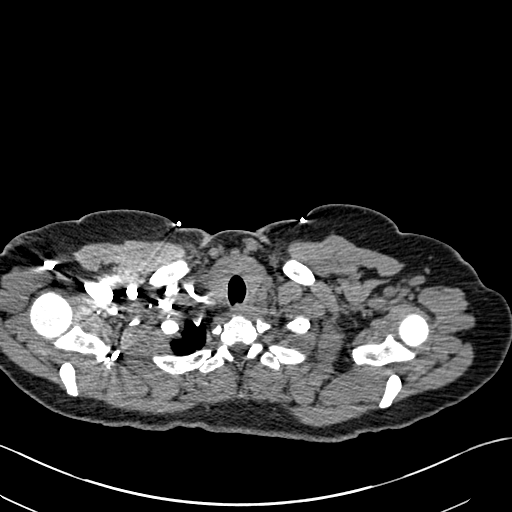

[18 of 32 positions shown; findings below may reference images not displayed]

FINDINGS: PULMONARY ARTERY: Adequate contrast opacification of the pulmonary
artery's. Main pulmonary artery is not enlarged. No pulmonary
arterial filling defects to the level of the subsegmental branches.

MEDIASTINUM: Heart is moderately enlarged. No RIGHT heart strain. No
pericardial fluid collections contrast refluxing into the inferior
vena cava. Thoracic aorta is normal course and caliber,
unremarkable. Hilar lymphadenopathy bilaterally, borderline
subcarinal lymphadenopathy.

LUNGS: Tracheobronchial tree is patent, no pneumothorax.
Peribronchial wall thickening. Moderate apparent centrilobular
emphysema. Patchy consolidation in the RIGHT greater than LEFT lung
bases with small RIGHT pleural effusion. 5 mm sub solid pulmonary
nodule LEFT lung apex. Mild paraseptal emphysema.

SOFT TISSUES AND OSSEOUS STRUCTURES: Included view of the abdomen is
unremarkable. Visualized soft tissues and included osseous
structures appear normal.

Review of the MIP images confirms the above findings.
IMPRESSION: No acute pulmonary embolism.

Moderate cardiomegaly. Bronchial wall thickening may refer reflect
bronchitis or pulmonary edema with small RIGHT pleural effusion.
Patchy presumed atelectasis in the lung bases.

Symmetric hilar lymphadenopathy, which could be infectious or
inflammatory, recommend follow-up.

Moderate centrilobular and to lesser extent paraseptal emphysema.

5 mm LEFT apical sub solid pulmonary nodule. Initial follow-up by
chest CT without contrast is recommended in 3 months to confirm
persistence. This recommendation follows the consensus statement:
Recommendations for the Management of Subsolid Pulmonary Nodules
Detected at CT: A Statement from the [HOSPITAL] as published

  By: Eraa Parsa

## 2016-10-12 IMAGING — CR DG CHEST 1V PORT
1 series · 1 of 1 positions shown · non-contrast
Comparison: 03/29/2014

CLINICAL DATA: Pt unable/unwilling to hold still or sit back for
imaging, pt is in "[facility] ED," Best image obtainable at this time.
Low O2 sats

EXAM:
PORTABLE CHEST - 1 VIEW

[AP]
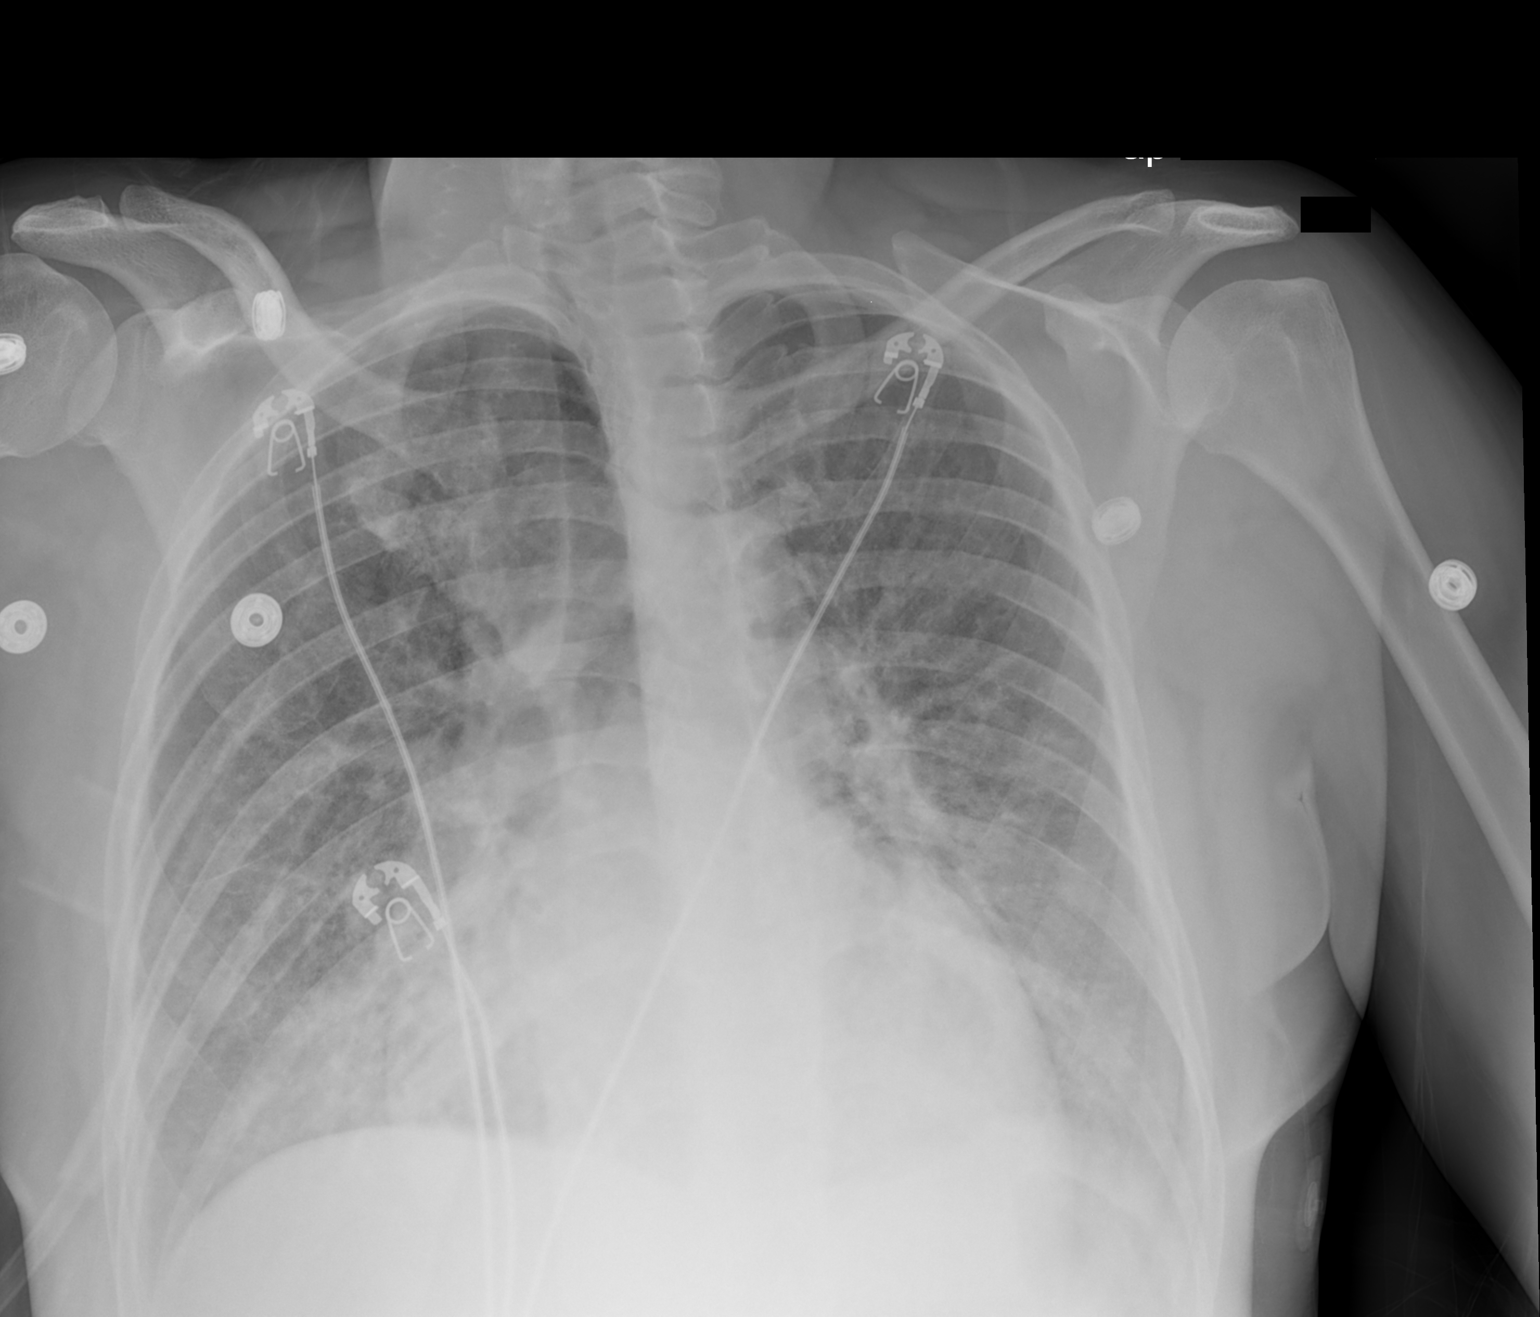

[1 of 1 positions shown; findings below may reference images not displayed]

FINDINGS: Cardiac silhouette is mildly enlarged. No mediastinal or hilar
masses. There is increased opacity in the perihilar and lower lung
zones when compared to the previous day's study. This may reflect
pulmonary edema, atelectasis or a combination. No pneumothorax.
IMPRESSION: Worsened lung aeration with increased perihilar and lower lung zone
opacity. This may be due to atelectasis, pulmonary edema or
infiltrate, or a combination.
